# Patient Record
Sex: Female | Born: 1975 | Race: White | Hispanic: No | Marital: Married | Smoking: Former smoker
Health system: Southern US, Community
[De-identification: ages and names within clinical notes are randomized; demographics above are authoritative.]

## PROBLEM LIST (undated history)

## (undated) DIAGNOSIS — N281 Cyst of kidney, acquired: Secondary | ICD-10-CM

## (undated) DIAGNOSIS — Z973 Presence of spectacles and contact lenses: Secondary | ICD-10-CM

## (undated) DIAGNOSIS — K219 Gastro-esophageal reflux disease without esophagitis: Secondary | ICD-10-CM

## (undated) HISTORY — PX: LEEP: SHX91

---

## 2004-11-22 ENCOUNTER — Emergency Department (HOSPITAL_COMMUNITY): Admission: EM | Admit: 2004-11-22 | Discharge: 2004-11-22 | Payer: Self-pay | Admitting: Emergency Medicine

## 2006-09-27 IMAGING — CR DG ANKLE COMPLETE 3+V*L*
3 series · 3 of 3 positions shown · non-contrast
Comparison: No prior studies.

CLINICAL DATA: Lateral malleolar pain. 
 3-VIEW LEFT ANKLE:

[t ankle joint ap left]
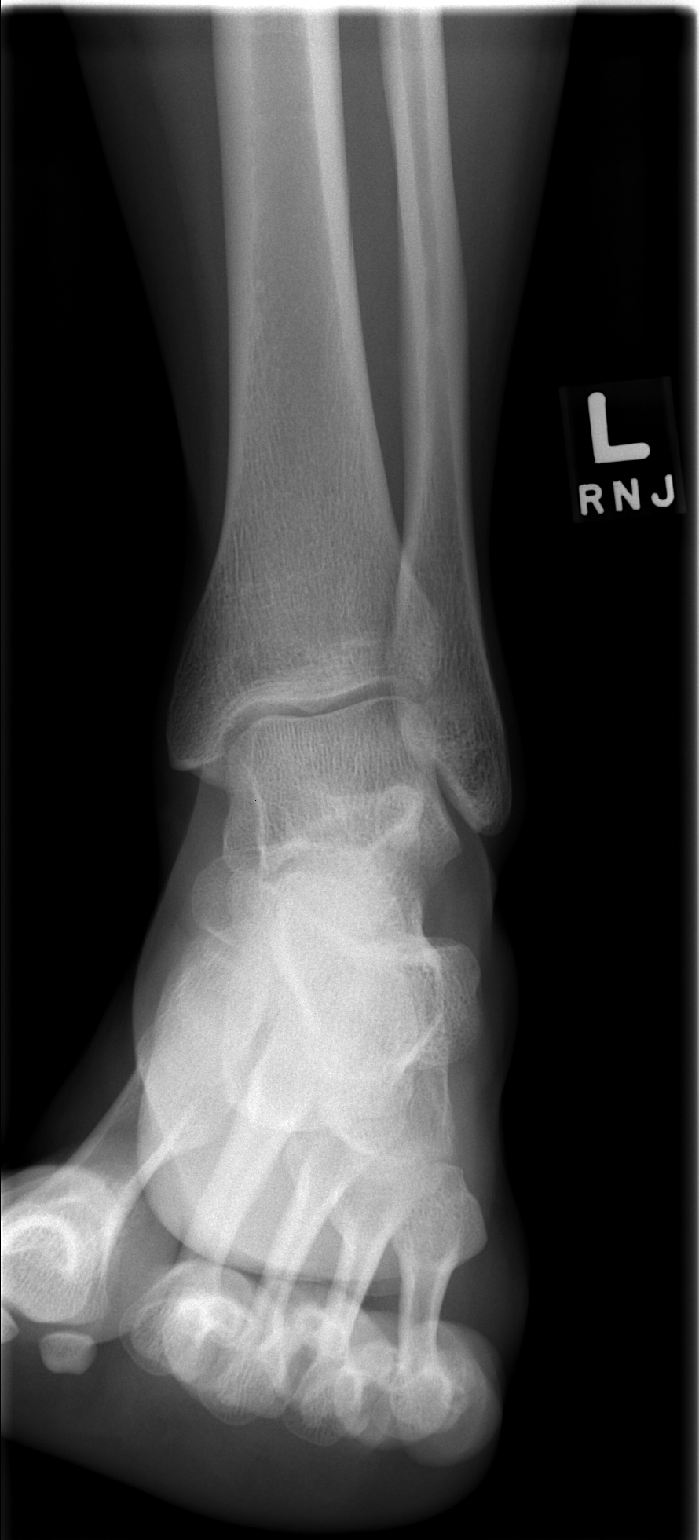

[t ankle joint oblique left]
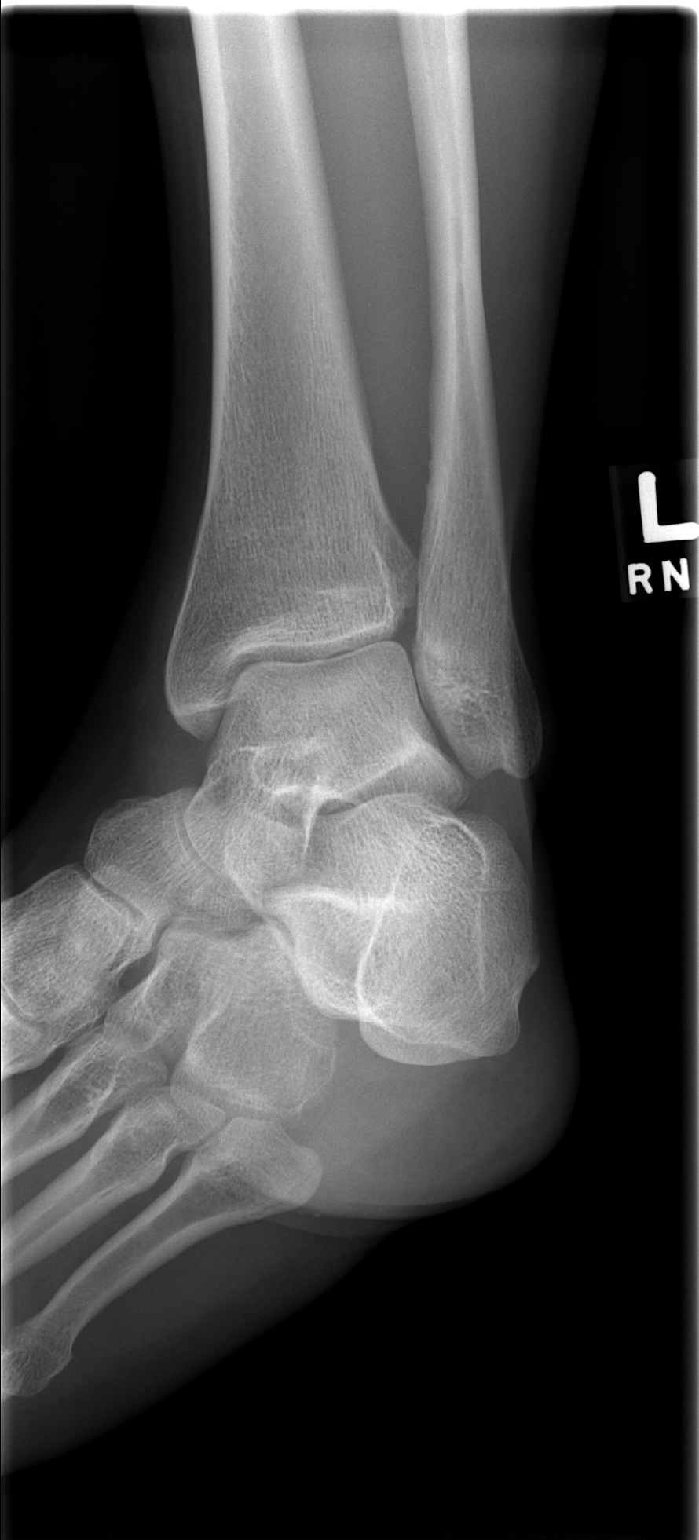

[t ankle joint lat left]
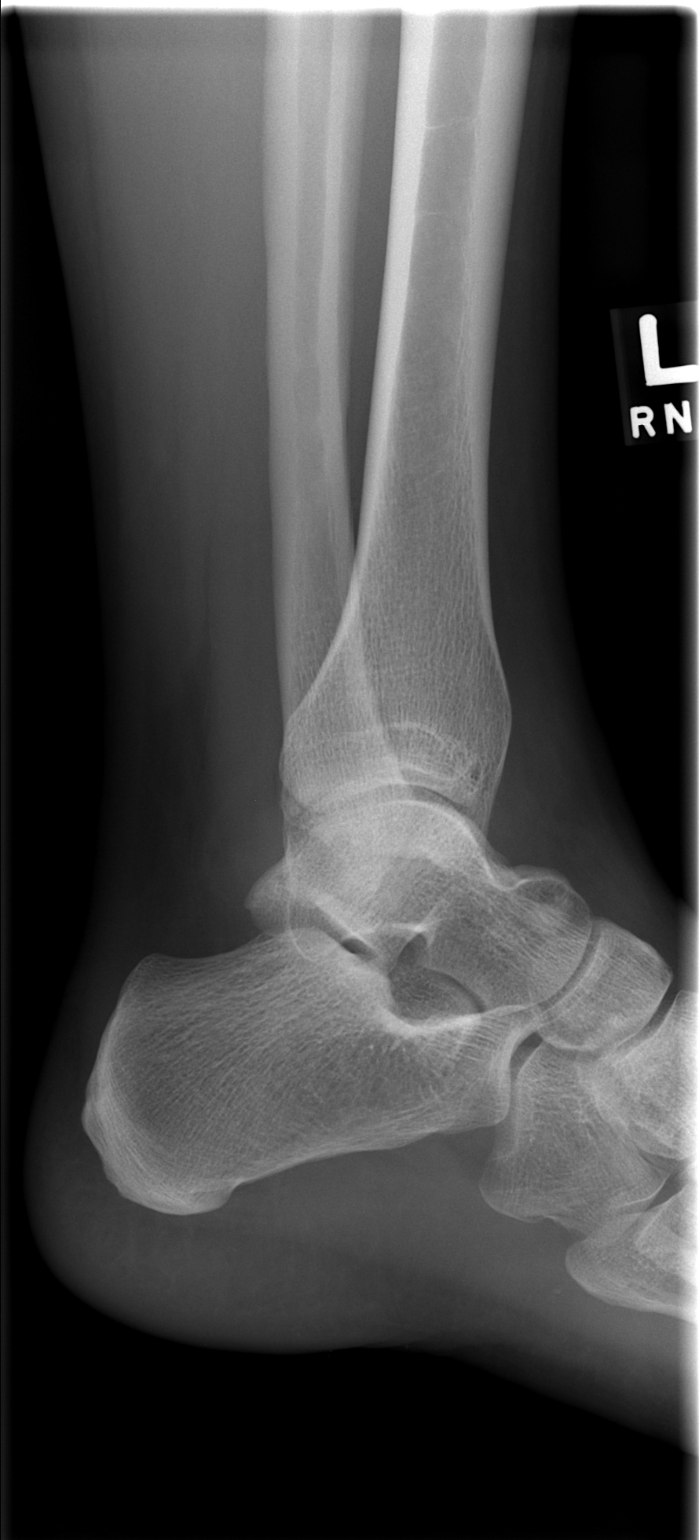

[3 of 3 positions shown; findings below may reference images not displayed]

There is no evidence of fracture, dislocation, or joint effusion.   There is no evidence of arthropathy or other focal bone abnormality.  Soft tissues are unremarkable.
IMPRESSION: Negative.

## 2011-10-11 ENCOUNTER — Other Ambulatory Visit (HOSPITAL_COMMUNITY): Payer: Self-pay | Admitting: Obstetrics and Gynecology

## 2011-10-11 DIAGNOSIS — N979 Female infertility, unspecified: Secondary | ICD-10-CM

## 2011-10-16 ENCOUNTER — Ambulatory Visit (HOSPITAL_COMMUNITY)
Admission: RE | Admit: 2011-10-16 | Discharge: 2011-10-16 | Disposition: A | Discharge status: Home / Self Care | Payer: BC Managed Care – PPO | Source: Ambulatory Visit | Attending: Obstetrics and Gynecology | Admitting: Obstetrics and Gynecology

## 2011-10-16 DIAGNOSIS — N979 Female infertility, unspecified: Secondary | ICD-10-CM | POA: Insufficient documentation

## 2011-10-16 MED ORDER — IOHEXOL 300 MG/ML  SOLN
20.0000 mL | Freq: Once | INTRAMUSCULAR | Status: AC | PRN
  Administered 2011-10-16: 20 mL

## 2011-12-31 LAB — OB RESULTS CONSOLE RUBELLA ANTIBODY, IGM: Rubella: IMMUNE

## 2011-12-31 LAB — OB RESULTS CONSOLE HEPATITIS B SURFACE ANTIGEN: Hepatitis B Surface Ag: NEGATIVE

## 2011-12-31 LAB — OB RESULTS CONSOLE HIV ANTIBODY (ROUTINE TESTING): HIV: NONREACTIVE

## 2011-12-31 LAB — OB RESULTS CONSOLE GC/CHLAMYDIA
Chlamydia: NEGATIVE
Gonorrhea: NEGATIVE

## 2011-12-31 LAB — OB RESULTS CONSOLE ABO/RH: RH Type: POSITIVE

## 2012-02-12 NOTE — L&D Delivery Note (Signed)
Delivery Note At 8:22 PM a viable female was delivered via OA Presentation  APGAR: 8, 9;  Placenta status: Intact, Spontaneous.  Cord: 3 vessels with the following complications: None.    Anesthesia: None  Episiotomy: None Lacerations: None Suture Repair: not applicable Est. Blood Loss (mL): 300  Mom to postpartum.  Baby to nursery-stable.  Summer Lindsey 08/02/2012, 8:30 PM

## 2012-07-15 LAB — OB RESULTS CONSOLE GBS: GBS: NEGATIVE

## 2012-08-02 ENCOUNTER — Inpatient Hospital Stay (HOSPITAL_COMMUNITY)
Admission: AD | Admit: 2012-08-02 | Discharge: 2012-08-04 | DRG: 373 | Disposition: A | Discharge status: Home / Self Care | Payer: BC Managed Care – PPO | Source: Ambulatory Visit | Attending: Obstetrics and Gynecology | Admitting: Obstetrics and Gynecology

## 2012-08-02 ENCOUNTER — Inpatient Hospital Stay (HOSPITAL_COMMUNITY)
Admission: AD | Admit: 2012-08-02 | Discharge: 2012-08-02 | Disposition: A | Discharge status: Home / Self Care | Payer: BC Managed Care – PPO | Source: Ambulatory Visit | Attending: Obstetrics and Gynecology | Admitting: Obstetrics and Gynecology

## 2012-08-02 ENCOUNTER — Encounter (HOSPITAL_COMMUNITY): Payer: Self-pay | Admitting: *Deleted

## 2012-08-02 DIAGNOSIS — O09519 Supervision of elderly primigravida, unspecified trimester: Principal | ICD-10-CM | POA: Diagnosis present

## 2012-08-02 DIAGNOSIS — O479 False labor, unspecified: Secondary | ICD-10-CM | POA: Insufficient documentation

## 2012-08-02 DIAGNOSIS — O99334 Smoking (tobacco) complicating childbirth: Secondary | ICD-10-CM | POA: Diagnosis present

## 2012-08-02 LAB — CBC
Hemoglobin: 12.5 g/dL (ref 12.0–15.0)
MCH: 30.2 pg (ref 26.0–34.0)
MCHC: 35.6 g/dL (ref 30.0–36.0)
Platelets: 184 10*3/uL (ref 150–400)
RDW: 13.9 % (ref 11.5–15.5)

## 2012-08-02 MED ORDER — TETANUS-DIPHTH-ACELL PERTUSSIS 5-2.5-18.5 LF-MCG/0.5 IM SUSP: 0.5000 mL | Freq: Once | INTRAMUSCULAR | Status: DC

## 2012-08-02 MED ORDER — LANOLIN HYDROUS EX OINT: TOPICAL_OINTMENT | CUTANEOUS | Status: DC | PRN

## 2012-08-02 MED ORDER — FLEET ENEMA 7-19 GM/118ML RE ENEM: 1.0000 | ENEMA | Freq: Every day | RECTAL | Status: DC | PRN

## 2012-08-02 MED ORDER — FENTANYL 2.5 MCG/ML BUPIVACAINE 1/10 % EPIDURAL INFUSION (WH - ANES): 14.0000 mL/h | INTRAMUSCULAR | Status: DC | PRN

## 2012-08-02 MED ORDER — ERYTHROMYCIN 5 MG/GM OP OINT: 1.0000 "application " | TOPICAL_OINTMENT | Freq: Once | OPHTHALMIC | Status: DC

## 2012-08-02 MED ORDER — EPHEDRINE 5 MG/ML INJ
10.0000 mg | INTRAVENOUS | Status: DC | PRN
  Filled 2012-08-02: qty 2

## 2012-08-02 MED ORDER — ONDANSETRON HCL 4 MG/2ML IJ SOLN: 4.0000 mg | Freq: Four times a day (QID) | INTRAMUSCULAR | Status: DC | PRN

## 2012-08-02 MED ORDER — PHENYLEPHRINE 40 MCG/ML (10ML) SYRINGE FOR IV PUSH (FOR BLOOD PRESSURE SUPPORT)
80.0000 ug | PREFILLED_SYRINGE | INTRAVENOUS | Status: DC | PRN
  Filled 2012-08-02: qty 2

## 2012-08-02 MED ORDER — IBUPROFEN 600 MG PO TABS
600.0000 mg | ORAL_TABLET | Freq: Four times a day (QID) | ORAL | Status: DC | PRN
  Administered 2012-08-02: 600 mg via ORAL
  Filled 2012-08-02: qty 1

## 2012-08-02 MED ORDER — IBUPROFEN 600 MG PO TABS
600.0000 mg | ORAL_TABLET | Freq: Four times a day (QID) | ORAL | Status: DC
  Administered 2012-08-03 – 2012-08-04 (×5): 600 mg via ORAL
  Filled 2012-08-02 (×5): qty 1

## 2012-08-02 MED ORDER — LACTATED RINGERS IV SOLN: 500.0000 mL | Freq: Once | INTRAVENOUS | Status: DC

## 2012-08-02 MED ORDER — CITRIC ACID-SODIUM CITRATE 334-500 MG/5ML PO SOLN: 30.0000 mL | ORAL | Status: DC | PRN

## 2012-08-02 MED ORDER — ACETAMINOPHEN 325 MG PO TABS: 650.0000 mg | ORAL_TABLET | ORAL | Status: DC | PRN

## 2012-08-02 MED ORDER — LACTATED RINGERS IV SOLN: INTRAVENOUS | Status: DC

## 2012-08-02 MED ORDER — LACTATED RINGERS IV SOLN: 500.0000 mL | INTRAVENOUS | Status: DC | PRN

## 2012-08-02 MED ORDER — OXYCODONE-ACETAMINOPHEN 5-325 MG PO TABS: 1.0000 | ORAL_TABLET | ORAL | Status: DC | PRN

## 2012-08-02 MED ORDER — HEPATITIS B VAC RECOMBINANT 10 MCG/0.5ML IJ SUSP
0.5000 mL | Freq: Once | INTRAMUSCULAR | Status: DC
  Filled 2012-08-02: qty 0.5

## 2012-08-02 MED ORDER — BENZOCAINE-MENTHOL 20-0.5 % EX AERO
1.0000 "application " | INHALATION_SPRAY | CUTANEOUS | Status: DC | PRN
  Filled 2012-08-02: qty 56

## 2012-08-02 MED ORDER — SENNOSIDES-DOCUSATE SODIUM 8.6-50 MG PO TABS
2.0000 | ORAL_TABLET | Freq: Every day | ORAL | Status: DC
  Administered 2012-08-03: 2 via ORAL

## 2012-08-02 MED ORDER — ONDANSETRON HCL 4 MG PO TABS: 4.0000 mg | ORAL_TABLET | ORAL | Status: DC | PRN

## 2012-08-02 MED ORDER — OXYTOCIN 40 UNITS IN LACTATED RINGERS INFUSION - SIMPLE MED
62.5000 mL/h | INTRAVENOUS | Status: DC
  Administered 2012-08-02: 62.5 mL/h via INTRAVENOUS
  Filled 2012-08-02: qty 1000

## 2012-08-02 MED ORDER — DIPHENHYDRAMINE HCL 50 MG/ML IJ SOLN: 12.5000 mg | INTRAMUSCULAR | Status: DC | PRN

## 2012-08-02 MED ORDER — LIDOCAINE HCL (PF) 1 % IJ SOLN
30.0000 mL | INTRAMUSCULAR | Status: DC | PRN
  Filled 2012-08-02 (×2): qty 30

## 2012-08-02 MED ORDER — BISACODYL 10 MG RE SUPP: 10.0000 mg | Freq: Every day | RECTAL | Status: DC | PRN

## 2012-08-02 MED ORDER — DIPHENHYDRAMINE HCL 25 MG PO CAPS: 25.0000 mg | ORAL_CAPSULE | Freq: Four times a day (QID) | ORAL | Status: DC | PRN

## 2012-08-02 MED ORDER — ZOLPIDEM TARTRATE 5 MG PO TABS: 5.0000 mg | ORAL_TABLET | Freq: Every evening | ORAL | Status: DC | PRN

## 2012-08-02 MED ORDER — OXYTOCIN BOLUS FROM INFUSION: 500.0000 mL | INTRAVENOUS | Status: DC

## 2012-08-02 MED ORDER — PRENATAL MULTIVITAMIN CH: 1.0000 | ORAL_TABLET | Freq: Every day | ORAL | Status: DC

## 2012-08-02 MED ORDER — MEASLES, MUMPS & RUBELLA VAC ~~LOC~~ INJ
0.5000 mL | INJECTION | Freq: Once | SUBCUTANEOUS | Status: AC
  Administered 2012-08-04: 0.5 mL via SUBCUTANEOUS
  Filled 2012-08-02: qty 0.5

## 2012-08-02 MED ORDER — WITCH HAZEL-GLYCERIN EX PADS: 1.0000 "application " | MEDICATED_PAD | CUTANEOUS | Status: DC | PRN

## 2012-08-02 MED ORDER — SIMETHICONE 80 MG PO CHEW: 80.0000 mg | CHEWABLE_TABLET | ORAL | Status: DC | PRN

## 2012-08-02 MED ORDER — ONDANSETRON HCL 4 MG/2ML IJ SOLN: 4.0000 mg | INTRAMUSCULAR | Status: DC | PRN

## 2012-08-02 MED ORDER — PRENATAL MULTIVITAMIN CH
1.0000 | ORAL_TABLET | Freq: Every day | ORAL | Status: DC
  Administered 2012-08-03: 1 via ORAL

## 2012-08-02 MED ORDER — MEDROXYPROGESTERONE ACETATE 150 MG/ML IM SUSP: 150.0000 mg | INTRAMUSCULAR | Status: DC | PRN

## 2012-08-02 MED ORDER — DIBUCAINE 1 % RE OINT: 1.0000 "application " | TOPICAL_OINTMENT | RECTAL | Status: DC | PRN

## 2012-08-02 MED ORDER — SUCROSE 24% NICU/PEDS ORAL SOLUTION
0.5000 mL | OROMUCOSAL | Status: DC | PRN
  Filled 2012-08-02: qty 0.5

## 2012-08-02 MED ORDER — VITAMIN K1 1 MG/0.5ML IJ SOLN
1.0000 mg | Freq: Once | INTRAMUSCULAR | Status: DC
  Filled 2012-08-02: qty 0.5

## 2012-08-02 NOTE — MAU Note (Signed)
Pt c/o ctx q 3-4 min. reports some bloody show and fetal moveent. 1cm dlated in office

## 2012-08-02 NOTE — MAU Note (Signed)
Patient presents to MAU with c/o contractions every 3-5 minutes since 0500 today. Reports some bloody show since last check on Thursday in office, where she was 1 cm.  Denies LOF. Reports +FM. Denies HSV.

## 2012-08-02 NOTE — MAU Note (Signed)
Pt here erliere. Ctx stronger closer together. Some more bloody show.

## 2012-08-02 NOTE — H&P (Signed)
Summer Lindsey is a 37 y.o. female presenting for labor. Maternal Medical History:  Reason for admission: Contractions.   Contractions: Onset was 13-24 hours ago.   Frequency: regular.   Perceived severity is strong.    Fetal activity: Perceived fetal activity is normal.    Prenatal complications: no prenatal complications   OB History   Grav Para Term Preterm Abortions TAB SAB Ect Mult Living   1              Past Medical History  Diagnosis Date  . Medical history non-contributory    Past Surgical History  Procedure Laterality Date  . Leep     Family History: family history is not on file. Social History:  reports that she has been smoking.  She does not have any smokeless tobacco history on file. She reports that she does not drink alcohol or use illicit drugs.   Prenatal Transfer Tool  Maternal Diabetes: No Genetic Screening: Normal Maternal Ultrasounds/Referrals: Normal Fetal Ultrasounds or other Referrals:  None Maternal Substance Abuse:  No Significant Maternal Medications:  None Significant Maternal Lab Results:  None Other Comments:  None  Review of Systems  Respiratory: Negative.   Cardiovascular: Negative.     Dilation: 8 Effacement (%): 100;0 Exam by:: Dr.Delfino Friesen Last menstrual period 10/08/2011. Maternal Exam:  Uterine Assessment: Contraction strength is moderate.  Contraction frequency is regular.   Abdomen: not evaluated.  Introitus: not evaluated.     Physical Exam  Cardiovascular: Normal rate, regular rhythm and normal heart sounds.   Respiratory: Effort normal.  GI: Soft.    Prenatal labs: ABO, Rh: O/Positive/-- (11/19 0000) Antibody:   Rubella: Immune (11/19 0000) RPR: Nonreactive (11/19 0000)  HBsAg: Negative (11/19 0000)  HIV: Non-reactive (11/19 0000)  GBS: Negative (06/04 0000)   Assessment/Plan: Labor  Anticipate nsvd   Summer Lindsey L 08/02/2012, 5:50 PM

## 2012-08-03 LAB — CBC
HCT: 31.8 % — ABNORMAL LOW (ref 36.0–46.0)
Hemoglobin: 11 g/dL — ABNORMAL LOW (ref 12.0–15.0)
MCH: 29.5 pg (ref 26.0–34.0)
MCHC: 34.6 g/dL (ref 30.0–36.0)

## 2012-08-03 LAB — RPR: RPR Ser Ql: NONREACTIVE

## 2012-08-03 NOTE — Progress Notes (Signed)
Post Partum Day 1 Subjective: no complaints, up ad lib, voiding and tolerating PO  Objective: Blood pressure 112/76, pulse 87, temperature 98.2 F (36.8 C), temperature source Oral, resp. rate 20, height 5\' 4"  (1.626 m), weight 173 lb (78.472 kg), last menstrual period 10/08/2011, unknown if currently breastfeeding.  Physical Exam:  General: alert and cooperative Lochia: appropriate Uterine Fundus: firm Incision: perineum intact DVT Evaluation: No evidence of DVT seen on physical exam. Negative Homan's sign. No cords or calf tenderness. No significant calf/ankle edema.   Recent Labs  08/02/12 1755 08/03/12 0640  HGB 12.5 11.0*  HCT 35.1* 31.8*    Assessment/Plan: Plan for discharge tomorrow   LOS: 1 day   Gaston Dase G 08/03/2012, 8:30 AM

## 2012-08-04 LAB — RUBELLA SCREEN: Rubella: 0.87 Index (ref <0.90)

## 2012-08-04 MED ORDER — MEASLES, MUMPS & RUBELLA VAC ~~LOC~~ INJ: 0.5000 mL | INJECTION | Freq: Once | SUBCUTANEOUS | Status: DC

## 2012-08-04 MED ORDER — IBUPROFEN 600 MG PO TABS: 600.0000 mg | ORAL_TABLET | Freq: Four times a day (QID) | ORAL | Status: DC

## 2012-08-04 NOTE — Discharge Summary (Signed)
Obstetric Discharge Summary Reason for Admission: onset of labor Prenatal Procedures: ultrasound Intrapartum Procedures: spontaneous vaginal delivery Postpartum Procedures: none Complications-Operative and Postpartum: none Hemoglobin  Date Value Range Status  08/03/2012 11.0* 12.0 - 15.0 g/dL Final     HCT  Date Value Range Status  08/03/2012 31.8* 36.0 - 46.0 % Final    Physical Exam:  General: alert and cooperative Lochia: appropriate Uterine Fundus: firm Incision: perineum intact DVT Evaluation: No evidence of DVT seen on physical exam. Negative Homan's sign. No cords or calf tenderness. No significant calf/ankle edema.  Discharge Diagnoses: Term Pregnancy-delivered  Discharge Information: Date: 08/04/2012 Activity: pelvic rest Diet: routine Medications: PNV and Ibuprofen Condition: stable Instructions: refer to practice specific booklet Discharge to: home   Newborn Data: Live born female  Birth Weight: 7 lb 14.6 oz (3590 g) APGAR: 8, 9  Home with mother.  Summer Lindsey G 08/04/2012, 8:32 AM

## 2012-08-04 NOTE — Progress Notes (Signed)
Does patient need MMR prior to discharge? Thanks

## 2012-08-06 ENCOUNTER — Ambulatory Visit (HOSPITAL_COMMUNITY)
Admission: RE | Admit: 2012-08-06 | Discharge: 2012-08-06 | Disposition: A | Discharge status: Home / Self Care | Payer: BC Managed Care – PPO | Source: Ambulatory Visit | Attending: Obstetrics and Gynecology | Admitting: Obstetrics and Gynecology

## 2012-08-06 NOTE — Lactation Note (Signed)
Adult Lactation Consultation Outpatient Visit Note  Patient Name: Summer Lindsey(mother)    BABY: [name] Date of Birth: Jul 24, 1975                                    DOB: [birth year] Gestational Age at Delivery: [redacted]w[redacted]d                BIRTH WEIGHT: 7-14.6 Type of Delivery: SVD                                     DISCHARGE WEIGHT: 7-8.1                                                                          WEIGHT TODAY:7-4.5(7%) Breastfeeding History: Frequency of Breastfeeding: every 1-1.5 hours Length of Feeding: 15-20 minutes each side Voids: 3 Stools: 8 green stools  Supplementing / Method:NONE Pumping:  Type of Pump:NONE   Frequency:  Volume:    Comments:    Consultation Evaluation:Mom and 4 day old baby here for feeding assessment.  Baby was just seen at pediatricians and it was suggested she make appointment today due to constant cluster feedings and sore nipples.  Mom states milk came in 2 days ago and breasts were very full and painful.  Breasts full today but no engorgement.  Both nipples have abrasions.  Mom states baby falls asleep easily during feeding.  Mom latched baby to breast with initial shallow latch but with chin tug and bringing baby in closer latch improved.  Demonstrated technique for good breast compression prior to feeding.  Instructed on good breast massage and compression to increase milk flow and feeding activity.  Baby responded well and mom noticed improvement in effectiveness and length of feeding.  Breast softened with feeding.  Baby latched to opposite breast well.  Comfort gels given with instructions.  Initial Feeding Assessment:20 MINUTES EACH BREAST Pre-feed Weight:3302 Post-feed Weight:3322 Amount Transferred:20 MLS Comments:  Additional Feeding Assessment: Pre-feed Weight: Post-feed Weight: Amount Transferred: Comments:  Additional Feeding Assessment: Pre-feed Weight: Post-feed Weight: Amount Transferred: Comments:  Total  Breast milk Transferred this Visit: 20 MLS Total Supplement Given: NONE  Additional Interventions:   Follow-Up  Dr. Donnie Coffin 08/08/12, LC office and BF support group prn      Hansel Feinstein 08/06/2012, 10:09 AM

## 2013-08-18 ENCOUNTER — Other Ambulatory Visit: Payer: Self-pay | Admitting: Internal Medicine

## 2013-08-18 DIAGNOSIS — R161 Splenomegaly, not elsewhere classified: Secondary | ICD-10-CM

## 2013-08-24 ENCOUNTER — Ambulatory Visit
Admission: RE | Admit: 2013-08-24 | Discharge: 2013-08-24 | Disposition: A | Discharge status: Home / Self Care | Payer: 59 | Source: Ambulatory Visit | Attending: Internal Medicine | Admitting: Internal Medicine

## 2013-08-24 DIAGNOSIS — R161 Splenomegaly, not elsewhere classified: Secondary | ICD-10-CM

## 2013-10-08 ENCOUNTER — Other Ambulatory Visit (HOSPITAL_COMMUNITY): Payer: Self-pay | Admitting: Urology

## 2013-10-08 DIAGNOSIS — N281 Cyst of kidney, acquired: Secondary | ICD-10-CM

## 2013-11-22 ENCOUNTER — Encounter (HOSPITAL_COMMUNITY): Payer: Self-pay | Admitting: Pharmacy Technician

## 2013-11-24 ENCOUNTER — Other Ambulatory Visit: Payer: Self-pay | Admitting: Radiology

## 2013-11-25 ENCOUNTER — Ambulatory Visit (HOSPITAL_COMMUNITY)
Admission: RE | Admit: 2013-11-25 | Discharge: 2013-11-25 | Disposition: A | Discharge status: Home / Self Care | Payer: 59 | Source: Ambulatory Visit | Attending: Urology | Admitting: Urology

## 2013-11-25 ENCOUNTER — Encounter (HOSPITAL_COMMUNITY): Payer: Self-pay

## 2013-11-25 DIAGNOSIS — Q6100 Congenital renal cyst, unspecified: Secondary | ICD-10-CM | POA: Insufficient documentation

## 2013-11-25 DIAGNOSIS — N281 Cyst of kidney, acquired: Secondary | ICD-10-CM

## 2013-11-25 HISTORY — DX: Cyst of kidney, acquired: N28.1

## 2013-11-25 LAB — HCG, SERUM, QUALITATIVE: Preg, Serum: NEGATIVE

## 2013-11-25 LAB — CBC
HCT: 40.8 % (ref 36.0–46.0)
Hemoglobin: 13.9 g/dL (ref 12.0–15.0)
MCH: 30.3 pg (ref 26.0–34.0)
MCHC: 34.1 g/dL (ref 30.0–36.0)
MCV: 88.9 fL (ref 78.0–100.0)
PLATELETS: 205 10*3/uL (ref 150–400)
RBC: 4.59 MIL/uL (ref 3.87–5.11)
RDW: 12.4 % (ref 11.5–15.5)
WBC: 6.7 10*3/uL (ref 4.0–10.5)

## 2013-11-25 LAB — APTT: APTT: 28 s (ref 24–37)

## 2013-11-25 LAB — PROTIME-INR
INR: 1 (ref 0.00–1.49)
PROTHROMBIN TIME: 13.3 s (ref 11.6–15.2)

## 2013-11-25 MED ORDER — SODIUM CHLORIDE 0.9 % IV SOLN
INTRAVENOUS | Status: DC
  Administered 2013-11-25: 11:00:00 via INTRAVENOUS

## 2013-11-25 MED ORDER — MIDAZOLAM HCL 2 MG/2ML IJ SOLN
INTRAMUSCULAR | Status: AC | PRN
  Administered 2013-11-25: 2 mg via INTRAVENOUS
  Administered 2013-11-25: 1 mg via INTRAVENOUS

## 2013-11-25 MED ORDER — FENTANYL CITRATE 0.05 MG/ML IJ SOLN
INTRAMUSCULAR | Status: AC | PRN
  Administered 2013-11-25: 100 ug via INTRAVENOUS

## 2013-11-25 MED ORDER — MIDAZOLAM HCL 2 MG/2ML IJ SOLN
INTRAMUSCULAR | Status: AC
  Filled 2013-11-25: qty 4

## 2013-11-25 MED ORDER — FENTANYL CITRATE 0.05 MG/ML IJ SOLN
INTRAMUSCULAR | Status: AC
  Filled 2013-11-25: qty 4

## 2013-11-25 NOTE — Procedures (Signed)
Interventional Radiology Procedure Note  Procedure: US guided aspiration of left renal cyst Complications: None Recommendations: - Bedrest x 30 minutes - DC home - Pt to monitor for sx recurrence  Signed,  Sterling BigHeath K. Delon Revelo, MD

## 2013-11-25 NOTE — Discharge Instructions (Signed)
Conscious Sedation, Adult, Care After °Refer to this sheet in the next few weeks. These instructions provide you with information on caring for yourself after your procedure. Your health care provider may also give you more specific instructions. Your treatment has been planned according to current medical practices, but problems sometimes occur. Call your health care provider if you have any problems or questions after your procedure. °WHAT TO EXPECT AFTER THE PROCEDURE  °After your procedure: °· You may feel sleepy, clumsy, and have poor balance for several hours. °· Vomiting may occur if you eat too soon after the procedure. °HOME CARE INSTRUCTIONS °· Do not participate in any activities where you could become injured for at least 24 hours. Do not: °¨ Drive. °¨ Swim. °¨ Ride a bicycle. °¨ Operate heavy machinery. °¨ Cook. °¨ Use power tools. °¨ Climb ladders. °¨ Work from a high place. °· Do not make important decisions or sign legal documents until you are improved. °· If you vomit, drink water, juice, or soup when you can drink without vomiting. Make sure you have little or no nausea before eating solid foods. °· Only take over-the-counter or prescription medicines for pain, discomfort, or fever as directed by your health care provider. °· Make sure you and your family fully understand everything about the medicines given to you, including what side effects may occur. °· You should not drink alcohol, take sleeping pills, or take medicines that cause drowsiness for at least 24 hours. °· If you smoke, do not smoke without supervision. °· If you are feeling better, you may resume normal activities 24 hours after you were sedated. °· Keep all appointments with your health care provider. °SEEK MEDICAL CARE IF: °· Your skin is pale or bluish in color. °· You continue to feel nauseous or vomit. °· Your pain is getting worse and is not helped by medicine. °· You have bleeding or swelling. °· You are still sleepy or  feeling clumsy after 24 hours. °SEEK IMMEDIATE MEDICAL CARE IF: °· You develop a rash. °· You have difficulty breathing. °· You develop any type of allergic problem. °· You have a fever. °MAKE SURE YOU: °· Understand these instructions. °· Will watch your condition. °· Will get help right away if you are not doing well or get worse. °Document Released: 11/18/2012 Document Reviewed: 11/18/2012 °[employer]® Patient Information ©2015 [employer]. This information is not intended to replace advice given to you by your health care provider. Make sure you discuss any questions you have with your health care provider. °Needle Biopsy °Care After °These instructions give you information on caring for yourself after your procedure. Your doctor may also give you more specific instructions. Call your doctor if you have any problems or questions after your procedure. °HOME CARE °· Rest for 4 hours after your biopsy, except for getting up to go to the bathroom or as told. °· Keep the places where the needles were put in clean and dry. °· Do not put powder or lotion on the sites. °· Do not shower until 24 hours after the test. Remove all bandages (dressings) before showering. °· Remove all bandages at least once every day. Gently clean the sites with soap and water. Keep putting a new bandage on until the skin is closed. °Finding out the results of your test °Ask your doctor when your test results will be ready. Make sure you follow up and get the test results. °GET HELP RIGHT AWAY IF:  °· You have shortness of   breath or trouble breathing. °· You have pain or cramping in your belly (abdomen). °· You feel sick to your stomach (nauseous) or throw up (vomit). °· Any of the places where the needles were put in: °¨ Are puffy (swollen) or red. °¨ Are sore or hot to the touch. °¨ Are draining yellowish-white fluid (pus). °¨ Are bleeding after 10 minutes of pressing down on the site. Have someone keep pressing on any place that is  bleeding until you see a doctor. °· You have any unusual pain that will not stop. °· You have a fever. °If you go to the emergency room, tell the nurse that you had a biopsy. Take this paper with you to show the nurse. °MAKE SURE YOU:  °· Understand these instructions. °· Will watch your condition. °· Will get help right away if you are not doing well or get worse. °Document Released: 01/11/2008 Document Revised: 04/22/2011 Document Reviewed: 01/11/2008 °Exit[employer]® Patient Information ©2015 [employer]. This information is not intended to replace advice given to you by your health care provider. Make sure you discuss any questions you have with your health care provider. ° °

## 2013-11-25 NOTE — H&P (Signed)
    Chief Complaint: "I am here for my left kidney cyst aspiration."   Referring Physician(s): Tannenbaum,Sigmund I  History of Present Illness: Summer Lindsey is a 38 y.o. 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 She is scheduled today for image guided cyst aspiration. She denies any current left CVA tenderness. She denies any chest pain, shortness of breath or palpitations. She denies any active signs of bleeding or excessive bruising. She denies any recent fever or chills. The patient denies any history of sleep apnea or chronic oxygen use. She has previously tolerated sedation without complications.    Past Medical History  Diagnosis Date  . Medical history non-contributory   . Cyst of kidney, acquired     left kidney-cyst    Past Surgical History  Procedure Laterality Date  . Leep      Allergies: Acetaminophen  Medications: Prior to Admission medications   Not on File    History reviewed. No pertinent family history.  History   Social History  . Marital Status: Married    Spouse Name: N/A    Number of Children: N/A  . Years of Education: N/A   Social History Main Topics  . Smoking status: Current Every Day Smoker -- 0.50 packs/day  . Smokeless tobacco: None  . Alcohol Use: No  . Drug Use: No  . Sexual Activity: Yes    Birth Control/ Protection: None   Other Topics Concern  . None   Social History Narrative  . None    Review of Systems: A 12 point ROS discussed and pertinent positives are indicated in the HPI above.  All other systems are negative.  Review of Systems  Vital Signs: BP 130/68  Pulse 67  Temp(Src) 98.3 F (36.8 C) (Oral)  Resp 16  Ht 5\' 4"  (1.626 m)  Wt 155 lb (70.308 kg)  BMI 26.59 kg/m2  SpO2 100%  LMP 11/15/2013  Breastfeeding? No  Physical Exam  Constitutional: She is oriented to person, place, and time. She appears well-developed and  well-nourished. No distress.  HENT:  Head: Normocephalic and atraumatic.  Neck: No tracheal deviation present.  Cardiovascular: Normal rate and regular rhythm.  Exam reveals no gallop and no friction rub.   No murmur heard. Pulmonary/Chest: Effort normal and breath sounds normal. No respiratory distress. She has no wheezes. She has no rales.  Abdominal: Soft. Bowel sounds are normal. She exhibits no distension. There is no tenderness.  Neurological: She is alert and oriented to person, place, and time.  Skin: Skin is warm and dry. She is not diaphoretic.  Psychiatric: She has a normal mood and affect. Her behavior is normal. Thought content normal.    Imaging: No results found.  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 All of the patient's questions were answered, patient is agreeable to proceed. Consent signed and in chart.    SignedBerneta Levins: Jayme Mednick D 11/25/2013, 12:31 PM

## 2013-12-13 ENCOUNTER — Encounter (HOSPITAL_COMMUNITY): Payer: Self-pay

## 2014-08-29 ENCOUNTER — Encounter (HOSPITAL_BASED_OUTPATIENT_CLINIC_OR_DEPARTMENT_OTHER): Payer: Self-pay | Admitting: *Deleted

## 2014-08-29 NOTE — Progress Notes (Addendum)
NPO AFTER MN WITH EXCEPTION CLEAR LIQUIDS (NO CREAM/ MILK PRODUCTS) UNTIL 0700.  NEEDS HG.  LAST ABO/Rh IN EPIC AND CHART FROM 2014 (O +).  PRE-OP ORDERS PENDING.

## 2014-08-30 ENCOUNTER — Ambulatory Visit (HOSPITAL_BASED_OUTPATIENT_CLINIC_OR_DEPARTMENT_OTHER): Payer: Commercial Managed Care - HMO | Admitting: Anesthesiology

## 2014-08-30 ENCOUNTER — Encounter (HOSPITAL_BASED_OUTPATIENT_CLINIC_OR_DEPARTMENT_OTHER): Payer: Self-pay | Admitting: *Deleted

## 2014-08-30 ENCOUNTER — Encounter (HOSPITAL_BASED_OUTPATIENT_CLINIC_OR_DEPARTMENT_OTHER)
Admission: RE | Disposition: A | Discharge status: Home / Self Care | Payer: Self-pay | Source: Ambulatory Visit | Attending: Obstetrics and Gynecology

## 2014-08-30 ENCOUNTER — Ambulatory Visit (HOSPITAL_BASED_OUTPATIENT_CLINIC_OR_DEPARTMENT_OTHER)
Admission: RE | Admit: 2014-08-30 | Discharge: 2014-08-30 | Disposition: A | Discharge status: Home / Self Care | Payer: Commercial Managed Care - HMO | Source: Ambulatory Visit | Attending: Obstetrics and Gynecology | Admitting: Obstetrics and Gynecology

## 2014-08-30 DIAGNOSIS — Z3A09 9 weeks gestation of pregnancy: Secondary | ICD-10-CM | POA: Diagnosis not present

## 2014-08-30 DIAGNOSIS — K219 Gastro-esophageal reflux disease without esophagitis: Secondary | ICD-10-CM | POA: Diagnosis not present

## 2014-08-30 DIAGNOSIS — L821 Other seborrheic keratosis: Secondary | ICD-10-CM | POA: Insufficient documentation

## 2014-08-30 DIAGNOSIS — O021 Missed abortion: Secondary | ICD-10-CM | POA: Diagnosis not present

## 2014-08-30 DIAGNOSIS — Z87891 Personal history of nicotine dependence: Secondary | ICD-10-CM | POA: Insufficient documentation

## 2014-08-30 HISTORY — PX: DILATION AND EVACUATION: SHX1459

## 2014-08-30 HISTORY — DX: Presence of spectacles and contact lenses: Z97.3

## 2014-08-30 HISTORY — DX: Gastro-esophageal reflux disease without esophagitis: K21.9

## 2014-08-30 LAB — POCT HEMOGLOBIN-HEMACUE: Hemoglobin: 13.9 g/dL (ref 12.0–15.0)

## 2014-08-30 SURGERY — DILATION AND EVACUATION, UTERUS
Anesthesia: Monitor Anesthesia Care | Site: Uterus

## 2014-08-30 MED ORDER — FENTANYL CITRATE (PF) 100 MCG/2ML IJ SOLN
25.0000 ug | INTRAMUSCULAR | Status: DC | PRN
  Filled 2014-08-30: qty 1

## 2014-08-30 MED ORDER — FENTANYL CITRATE (PF) 100 MCG/2ML IJ SOLN
INTRAMUSCULAR | Status: AC
  Filled 2014-08-30: qty 4

## 2014-08-30 MED ORDER — DEXTROSE 5 % IV SOLN
2.0000 g | INTRAVENOUS | Status: AC
  Administered 2014-08-30: 2 g via INTRAVENOUS
  Filled 2014-08-30: qty 2

## 2014-08-30 MED ORDER — LIDOCAINE HCL (CARDIAC) 20 MG/ML IV SOLN
INTRAVENOUS | Status: DC | PRN
  Administered 2014-08-30: 40 mg via INTRAVENOUS

## 2014-08-30 MED ORDER — MIDAZOLAM HCL 5 MG/5ML IJ SOLN
INTRAMUSCULAR | Status: DC | PRN
  Administered 2014-08-30: 2 mg via INTRAVENOUS
  Administered 2014-08-30 (×2): 1 mg via INTRAVENOUS

## 2014-08-30 MED ORDER — MIDAZOLAM HCL 2 MG/2ML IJ SOLN
INTRAMUSCULAR | Status: AC
  Filled 2014-08-30: qty 2

## 2014-08-30 MED ORDER — FENTANYL CITRATE (PF) 100 MCG/2ML IJ SOLN
INTRAMUSCULAR | Status: DC | PRN
  Administered 2014-08-30: 50 ug via INTRAVENOUS
  Administered 2014-08-30 (×2): 25 ug via INTRAVENOUS

## 2014-08-30 MED ORDER — KETOROLAC TROMETHAMINE 30 MG/ML IJ SOLN
INTRAMUSCULAR | Status: DC | PRN
  Administered 2014-08-30: 30 mg via INTRAVENOUS

## 2014-08-30 MED ORDER — DEXAMETHASONE SODIUM PHOSPHATE 4 MG/ML IJ SOLN
INTRAMUSCULAR | Status: DC | PRN
  Administered 2014-08-30: 4 mg via INTRAVENOUS

## 2014-08-30 MED ORDER — LACTATED RINGERS IV SOLN
INTRAVENOUS | Status: DC
  Administered 2014-08-30: 12:00:00 via INTRAVENOUS
  Filled 2014-08-30: qty 1000

## 2014-08-30 MED ORDER — PROPOFOL 500 MG/50ML IV EMUL
INTRAVENOUS | Status: DC | PRN
  Administered 2014-08-30: 160 ug/kg/min via INTRAVENOUS

## 2014-08-30 MED ORDER — ONDANSETRON HCL 4 MG/2ML IJ SOLN
4.0000 mg | Freq: Once | INTRAMUSCULAR | Status: DC | PRN
  Filled 2014-08-30: qty 2

## 2014-08-30 MED ORDER — CEFOTETAN DISODIUM-DEXTROSE 2-2.08 GM-% IV SOLR
INTRAVENOUS | Status: AC
Start: 2014-08-30 — End: 2014-08-30
  Filled 2014-08-30: qty 50

## 2014-08-30 MED ORDER — SODIUM CHLORIDE 0.9 % IR SOLN
Status: DC | PRN
  Administered 2014-08-30: 500 mL

## 2014-08-30 MED ORDER — METHYLERGONOVINE MALEATE 0.2 MG/ML IJ SOLN
INTRAMUSCULAR | Status: DC | PRN
  Administered 2014-08-30: 0.2 mg via INTRAMUSCULAR

## 2014-08-30 MED ORDER — ONDANSETRON HCL 4 MG/2ML IJ SOLN
INTRAMUSCULAR | Status: DC | PRN
Start: 2014-08-30 — End: 2014-08-30
  Administered 2014-08-30: 4 mg via INTRAVENOUS

## 2014-08-30 SURGICAL SUPPLY — 34 items
CATH ROBINSON RED A/P 16FR (CATHETERS) ×3 IMPLANT
COVER BACK TABLE 60X90IN (DRAPES) ×3 IMPLANT
DRAPE LG THREE QUARTER DISP (DRAPES) ×3 IMPLANT
DRAPE UNDERBUTTOCKS STRL (DRAPE) ×3 IMPLANT
DRSG TELFA 3X8 NADH (GAUZE/BANDAGES/DRESSINGS) ×3 IMPLANT
GLOVE BIO SURGEON STRL SZ 6.5 (GLOVE) ×1 IMPLANT
GLOVE BIO SURGEON STRL SZ8.5 (GLOVE) ×3 IMPLANT
GLOVE BIO SURGEONS STRL SZ 6.5 (GLOVE) ×1
GLOVE INDICATOR 6.5 STRL GRN (GLOVE) ×2 IMPLANT
GLOVE SURG ORTHO 8.0 STRL STRW (GLOVE) ×3 IMPLANT
GOWN STRL REUS W/ TWL LRG LVL3 (GOWN DISPOSABLE) ×1 IMPLANT
GOWN STRL REUS W/ TWL XL LVL3 (GOWN DISPOSABLE) ×1 IMPLANT
GOWN STRL REUS W/TWL LRG LVL3 (GOWN DISPOSABLE) ×6
GOWN STRL REUS W/TWL XL LVL3 (GOWN DISPOSABLE) ×3
KIT BERKELEY 1ST TRIMESTER 3/8 (MISCELLANEOUS) ×3 IMPLANT
LEGGING LITHOTOMY PAIR STRL (DRAPES) ×3 IMPLANT
NDL SPNL 22GX3.5 QUINCKE BK (NEEDLE) ×1 IMPLANT
NEEDLE SPNL 22GX3.5 QUINCKE BK (NEEDLE) ×3 IMPLANT
NS IRRIG 500ML POUR BTL (IV SOLUTION) IMPLANT
PACK BASIN DAY SURGERY FS (CUSTOM PROCEDURE TRAY) ×3 IMPLANT
PAD DRESSING TELFA 3X8 NADH (GAUZE/BANDAGES/DRESSINGS) ×1 IMPLANT
PAD OB MATERNITY 4.3X12.25 (PERSONAL CARE ITEMS) ×3 IMPLANT
PAD PREP 24X48 CUFFED NSTRL (MISCELLANEOUS) ×3 IMPLANT
SET BERKELEY SUCTION TUBING (SUCTIONS) ×3 IMPLANT
SURGILUBE 2OZ TUBE FLIPTOP (MISCELLANEOUS) ×3 IMPLANT
SUT VICRYL RAPIDE 4/0 PS 2 (SUTURE) ×2 IMPLANT
SYR CONTROL 10ML LL (SYRINGE) ×3 IMPLANT
TOP DISP BERKELEY (MISCELLANEOUS) ×3 IMPLANT
TOWEL OR 17X24 6PK STRL BLUE (TOWEL DISPOSABLE) ×3 IMPLANT
TRAY DSU PREP LF (CUSTOM PROCEDURE TRAY) ×3 IMPLANT
VACURETTE 7MM CVD STRL WRAP (CANNULA) IMPLANT
VACURETTE 8 RIGID CVD (CANNULA) IMPLANT
VACURETTE 9 RIGID CVD (CANNULA) IMPLANT
WATER STERILE IRR 500ML POUR (IV SOLUTION) ×3 IMPLANT

## 2014-08-30 NOTE — Anesthesia Procedure Notes (Signed)
Procedure Name: MAC Performed by: Carvell Hoeffner G Pre-anesthesia Checklist: Patient identified, Timeout performed, Emergency Drugs available, Suction available and Patient being monitored Patient Re-evaluated:Patient Re-evaluated prior to inductionOxygen Delivery Method: Nasal cannula Placement Confirmation: positive ETCO2     

## 2014-08-30 NOTE — Transfer of Care (Signed)
Last Vitals:  Filed Vitals:   08/30/14 1208  BP: 116/75  Pulse: 73  Temp: 37 C

## 2014-08-30 NOTE — Transfer of Care (Signed)
Immediate Anesthesia Transfer of Care NoteImmediate Anesthesia Transfer of Care Note  Patient: Summer Lindsey  Procedure(s) Performed: Procedure(s) (LRB): DILATATION AND EVACUATION 6 WKS (N/A)  Patient Location: PACU  Anesthesia Type: mac  Level of Consciousness: awake, alert  and oriented  Airway & Oxygen Therapy: Patient Spontanous Breathing and Patient connected to oxygen  Post-op Assessment: Report given to PACU RN and Post -op Vital signs reviewed and stable  Post vital signs: Reviewed and stable  Complications: No apparent anesthesia complications

## 2014-08-30 NOTE — Anesthesia Preprocedure Evaluation (Addendum)
Anesthesia Evaluation  Patient identified by MRN, date of birth, ID band Patient awake    Reviewed: Allergy & Precautions, NPO status , Patient's Chart, lab work & pertinent test results  History of Anesthesia Complications Negative for: history of anesthetic complications  Airway Mallampati: II  TM Distance: >3 FB Neck ROM: Full    Dental no notable dental hx. (+) Dental Advisory Given   Pulmonary Current Smoker,  breath sounds clear to auscultation  Pulmonary exam normal       Cardiovascular negative cardio ROS Normal cardiovascular examRhythm:Regular Rate:Normal     Neuro/Psych negative neurological ROS  negative psych ROS   GI/Hepatic Neg liver ROS, GERD-  Medicated and Controlled,  Endo/Other  negative endocrine ROS  Renal/GU negative Renal ROS  negative genitourinary   Musculoskeletal negative musculoskeletal ROS (+)   Abdominal   Peds negative pediatric ROS (+)  Hematology negative hematology ROS (+)   Anesthesia Other Findings   Reproductive/Obstetrics 6 week missed ab                          Anesthesia Physical Anesthesia Plan  ASA: II  Anesthesia Plan: MAC   Post-op Pain Management:    Induction: Intravenous  Airway Management Planned: Nasal Cannula  Additional Equipment:   Intra-op Plan:   Post-operative Plan:   Informed Consent: I have reviewed the patients History and Physical, chart, labs and discussed the procedure including the risks, benefits and alternatives for the proposed anesthesia with the patient or authorized representative who has indicated his/her understanding and acceptance.   Dental advisory given  Plan Discussed with: CRNA  Anesthesia Plan Comments:         Anesthesia Quick Evaluation

## 2014-08-30 NOTE — Op Note (Signed)
[name]:  Perfecto, Marticia            ACCOUNT NO.:  [id number]  MEDICAL RECORD NO.:  [id number]  LOCATION:                               [facility]  PHYSICIAN:  Dineen KidDavid C. Rana SnareLowe, M.D.    DATE OF BIRTH:  11-28-1975  DATE OF PROCEDURE:  08/30/2014 DATE OF DISCHARGE:  08/30/2014                              OPERATIVE REPORT   PREOPERATIVE DIAGNOSIS:  Missed abortion, approximately [redacted] weeks gestational age, and desires mole removal.  POSTOPERATIVE DIAGNOSIS:  Missed abortion, approximately 439 weeks gestational age, and desires mole removal.  PROCEDURE:  Dilation, evacuation and removal of left labial mole.  SURGEON:  Candice Campavid Tyreisha Ungar, M.D.  ANESTHESIA:  Monitored anesthetic care and paracervical block.  INDICATIONS:  [name] is a 39 year old, g 3, P 1, at 269 weeks gestational age with ultrasound confirming a missed abortion approximately 6 and half weeks gestational size.  She desires dilation and evacuation.  Risks and benefits of procedure were discussed at length.  Informed consent was obtained.  She furthermore requests removal of the left mons pubis mole and gain risks and benefits were discussed.  DESCRIPTION OF PROCEDURE:  After adequate analgesia, the patient placed in the dorsal lithotomy position.  She is sterilely prepped and draped. Bladder sterilely drained.  Graves speculum was placed.  Tenaculum placed into the cervix.  Paracervical block was placed with 1% Xylocaine and 1:100,000 epinephrine, total 20 mL used. The mons pubis mole was similarly infiltrated with 1% Xylocaine.  Uterus was sounded to 9 cm, easily dilated to a #25 Pratt dilator.  An 8 mm suction curette was inserted.  Suction curettage was carried out retrieving products of conception.  This was performed until a gritty surface was felt throughout the endometrial cavity and no further products retrieved. The patient was given Methergine 0.2 mg IM with good uterine response. The tenaculum and the cervix  turned to be hemostatic.  Once pubis mole was grasped with forceps and easily dissected from the skin using Metzenbaum scissors, the base was then closed with 2 figure-of-eight closer to interrupted sutures with 4-0 Vicryl Rapide with good approximation and good hemostasis noted.  The patient was stable on transfer to recovery room.  Sponge and instrument count was normal x3. Estimated blood loss was minimal.  The patient received 2 g of cefotetan preoperatively, Methergine 0.2 mg IM intraoperatively, and Toradol 30 mg IV intraoperatively.  DISPOSITION:  The patient will be discharged home with followup in the office in 2-3 weeks.  Sent with a routine instruction for D and E; told to return for increased pain, fever, or bleeding.     Dineen Kidavid C. Rana SnareLowe, M.D.     DCL/MEDQ  D:  08/30/2014  T:  08/30/2014  Job:  [id number]

## 2014-08-30 NOTE — Procedures (Signed)
D&E without complications Note dictated Pt also desired mons pubic mole removal, also done without compllcations DL

## 2014-08-30 NOTE — H&P (Signed)
  Summer Lindsey is a 39 yo G3P1 who should be [redacted] weeks EGA but has had 3 ultrasounds showing S<dates and last one on 08/23/14 c/w nonviable pregnancy measuring 6.5 weeks She has not had any bleeding or cramping and at this time requests D&E  Abd Soft nt Pelvic  Uterus 6-8 weeks AV nt Cx cl thick  Imp Plan Missed abortion - [redacted] weeks EGA (6.5 weeks size) Plan D&E R&B discussed at length and informed consent obtained.  Blood type O+

## 2014-08-30 NOTE — Anesthesia Postprocedure Evaluation (Signed)
  Anesthesia Post-op Note  Patient: Summer Lindsey  Procedure(s) Performed: Procedure(s): DILATATION AND EVACUATION 6 WKS (N/A)  Patient Location: PACU  Anesthesia Type:MAC  Level of Consciousness: awake  Airway and Oxygen Therapy: Patient Spontanous Breathing  Post-op Pain: mild  Post-op Assessment: Post-op Vital signs reviewed              Post-op Vital Signs: Reviewed  Last Vitals:  Filed Vitals:   08/30/14 1435  BP:   Pulse: 68  Temp:   Resp: 17    Complications: No apparent anesthesia complications

## 2014-08-30 NOTE — Discharge Instructions (Signed)

## 2014-08-31 ENCOUNTER — Encounter (HOSPITAL_BASED_OUTPATIENT_CLINIC_OR_DEPARTMENT_OTHER): Payer: Self-pay | Admitting: Obstetrics and Gynecology

## 2014-09-16 NOTE — Addendum Note (Signed)
Addendum  created 09/16/14 1208 by Judie Petit, MD   Modules edited: Anesthesia Attestations

## 2015-06-16 ENCOUNTER — Other Ambulatory Visit: Payer: Self-pay | Admitting: Obstetrics and Gynecology

## 2015-09-30 IMAGING — US US ASPIRATION
1 series · 5 of 5 positions shown · non-contrast
Comparison: none

CLINICAL DATA: 38-year-old female with large mildly complex left
renal cyst and intermittent left lower quadrant and left flank pain.
TECHNIQUE: Informed consent was obtained from the patient following explanation
of the procedure, risks, benefits and alternatives. The patient
understands, agrees and consents for the procedure. All questions
were addressed. A time out was performed.

[Series 1: us aspiration · 0.25mm/px · 5 of 5 slices shown]
[im 1/5]
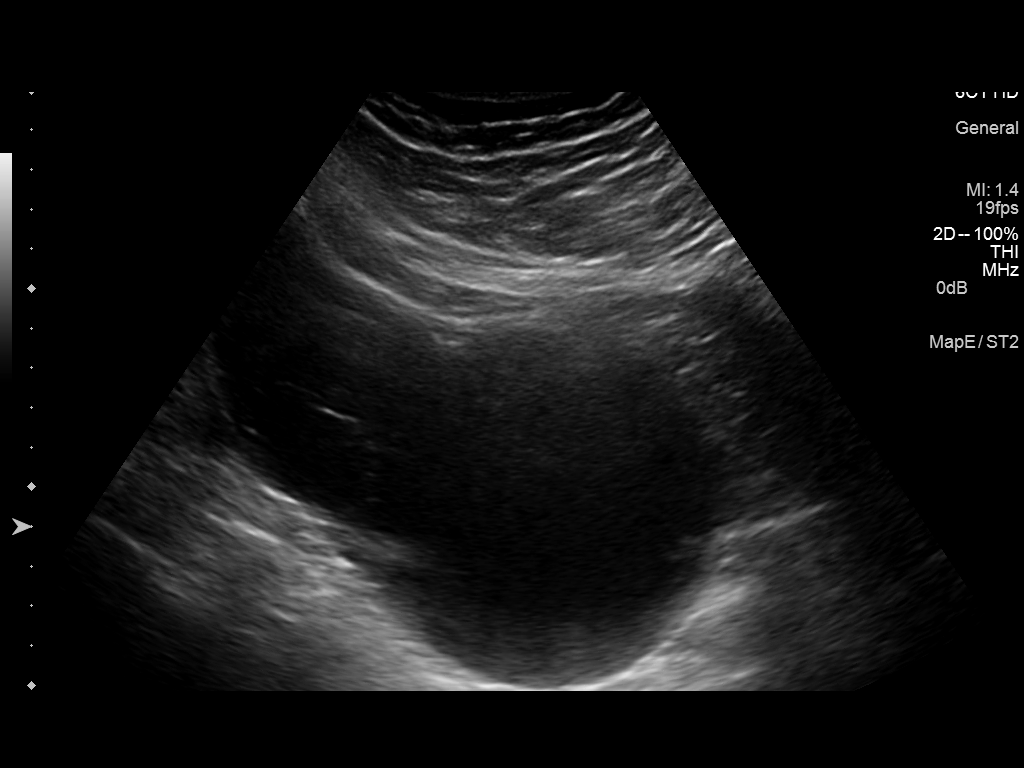
[im 2/5]
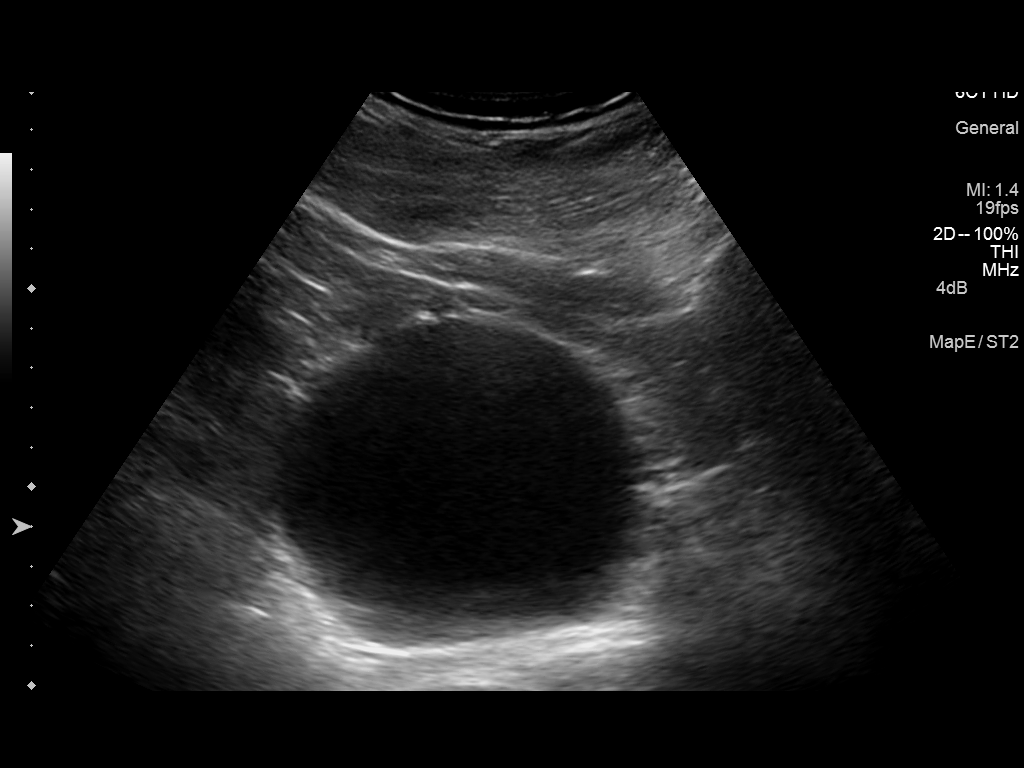
[im 3/5]
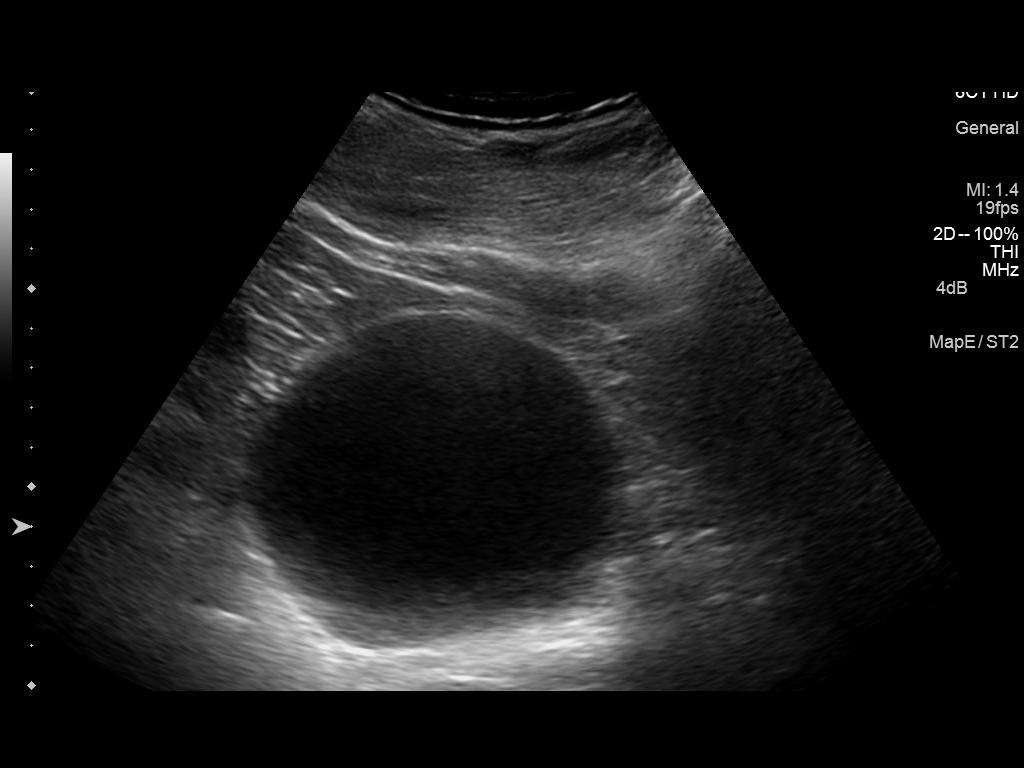
[im 4/5]
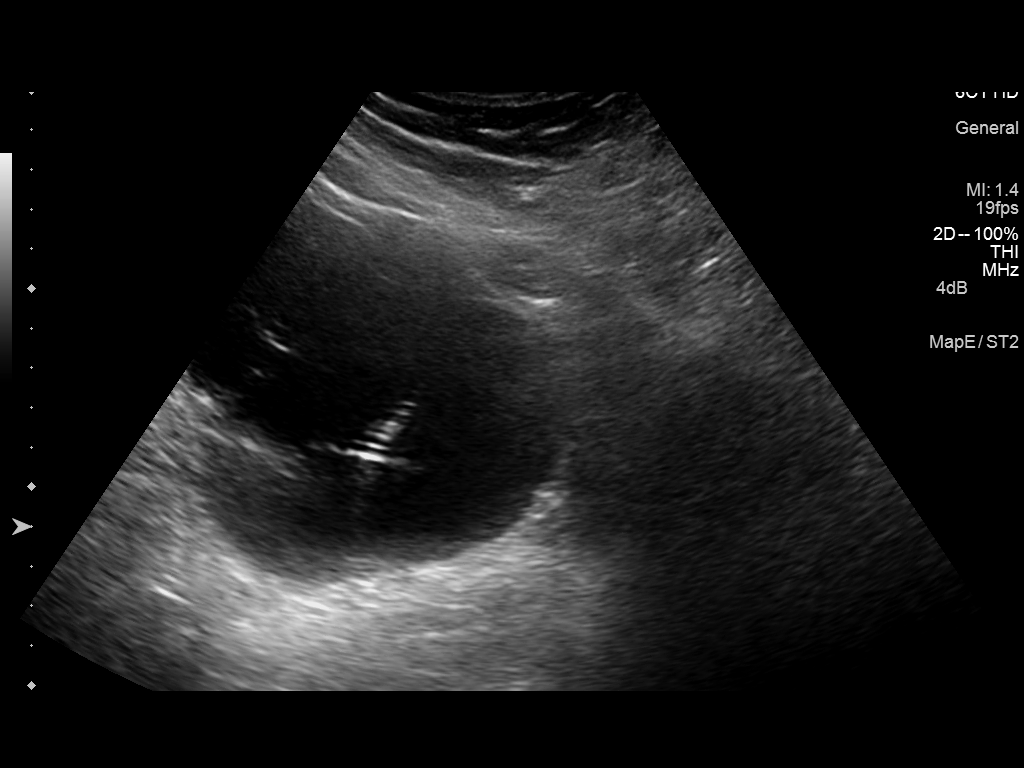
[im 5/5]
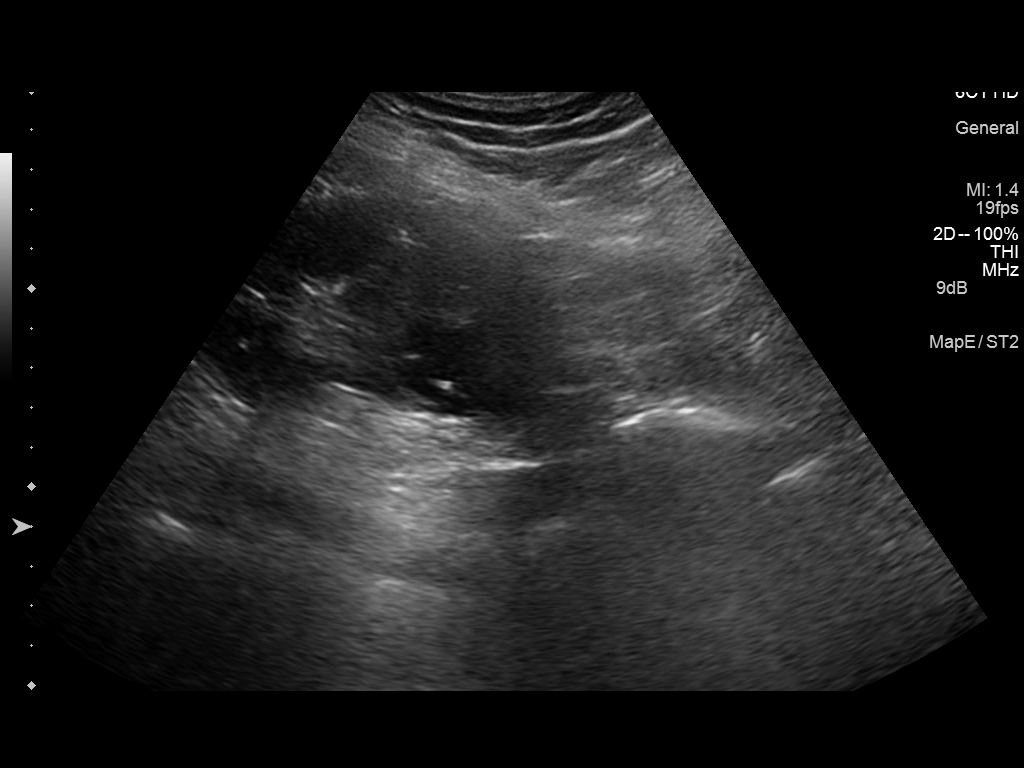

[5 of 5 positions shown; findings below may reference images not displayed]

EXAM:
US ASPIRATION

Date: 11/25/2013

PROCEDURE:
1. Ultrasound-guided cyst aspiration, left kidney

ANESTHESIA/SEDATION:
Moderate (conscious) sedation was used. Three mg Versed, 100 mcg
Fentanyl were administered intravenously. The patient's vital signs
were monitored continuously by radiology nursing throughout the
procedure.

Sedation Time: 15 minutes
The left flank was interrogated with ultrasound. The large mildly
complex cyst emanating from the lower pole of the left kidney was
successfully identified. A suitable skin entry site was selected and
marked. The region was then prepped and draped in the standard
fashion using Betadine skin prep. Local anesthesia was attained by
infiltration with 1% lidocaine. A small dermatotomy was made. Under
real-time sonographic guidance, a 5 Cyrus Cobian centesis catheter
was advanced into the renal cyst.

Approximately 350 cc of clear straw-colored fluid was successfully
aspirated. Post aspiration ultrasound imaging demonstrates total
collapse of the cyst walls. The fluid was sent to pathology for
cytopathologic evaluation. The Kojira centesis catheter was removed
and a sterile Band-Aid was placed. The patient tolerated the
procedure very well.

COMPLICATIONS:
None
IMPRESSION: Successful ultrasound-guided aspiration of mildly complex left lower
pole renal cyst yielding 350 mL clear, straw-colored fluid.

The fluid was sent for cytology.

PLAN:
Occasionally symptomatic renal cysts can recur. The patient is to
monitor herself for recurrent symptoms. If the symptoms takes
several years to recur, intermittent ultrasound-guided aspiration
may be sufficient for symptomatic relief. If, however, symptoms
recur in less than a year then repeat drainage with the drain
placement and 3 days of serial doxycycline from sclerosis may be
warranted tube prevent future cyst recurrence.

## 2015-12-08 ENCOUNTER — Encounter (HOSPITAL_COMMUNITY): Payer: Self-pay | Admitting: Emergency Medicine

## 2015-12-08 ENCOUNTER — Ambulatory Visit (HOSPITAL_COMMUNITY)
Admission: EM | Admit: 2015-12-08 | Discharge: 2015-12-08 | Disposition: A | Discharge status: Home / Self Care | Payer: No Typology Code available for payment source | Attending: Internal Medicine | Admitting: Internal Medicine

## 2015-12-08 ENCOUNTER — Ambulatory Visit (INDEPENDENT_AMBULATORY_CARE_PROVIDER_SITE_OTHER): Payer: No Typology Code available for payment source

## 2015-12-08 DIAGNOSIS — M25462 Effusion, left knee: Secondary | ICD-10-CM

## 2015-12-08 DIAGNOSIS — M1712 Unilateral primary osteoarthritis, left knee: Secondary | ICD-10-CM

## 2015-12-08 DIAGNOSIS — M25562 Pain in left knee: Secondary | ICD-10-CM

## 2015-12-08 NOTE — ED Provider Notes (Signed)
CSN: [id number]     Arrival date & time 12/08/15  1010 History   None    Chief Complaint  Patient presents with  . Knee Pain   (Consider location/radiation/quality/duration/timing/severity/associated sxs/prior Treatment) HPI NP 40 Y/O FEMALE WITH ONSET OF LEFT KNEE PAIN WITHOUT TRAUMA. SUDDEN ONSET OF PAIN. WALKING IMPROVES SYMPTOMS. HAS A STIFFNESS IN THE MORNING THAT SHE HAS TO WALK OFF. WORKS AS AN ACCOUNTANT. STANDING DESK. Past Medical History:  Diagnosis Date  . Cyst of kidney, acquired    left kidney-cyst  . Mild acid reflux   . Wears contact lenses    Past Surgical History:  Procedure Laterality Date  . DILATION AND EVACUATION N/A 08/30/2014   Procedure: DILATATION AND EVACUATION 6 WKS;  Surgeon: Candice Camp, MD;  Location: [facility];  Service: Gynecology;  Laterality: N/A;  . LEEP     History reviewed. No pertinent family history. Social History  Substance Use Topics  . Smoking status: Former Smoker    Packs/day: 0.50    Years: 20.00    Types: Cigarettes  . Smokeless tobacco: Never Used  . Alcohol use No   OB History    Gravida Para Term Preterm AB Living   1 1 1     1    SAB TAB Ectopic Multiple Live Births           1     Review of Systems  Denies: HEADACHE, NAUSEA, ABDOMINAL PAIN, CHEST PAIN, CONGESTION, DYSURIA, SHORTNESS OF BREATH  Allergies  Acetaminophen and Asa [aspirin]  Home Medications   Prior to Admission medications   Not on File   Meds Ordered and Administered this Visit  Medications - No data to display  LMP 11/23/2015  No data found.   Physical Exam NURSES NOTES AND VITAL SIGNS REVIEWED. CONSTITUTIONAL: Well developed, well nourished, no acute distress HEENT: normocephalic, atraumatic EYES: Conjunctiva normal NECK:normal ROM, supple, no adenopathy PULMONARY:No respiratory distress, normal effort ABDOMINAL: Soft, ND, NT BS+, No CVAT MUSCULOSKELETAL: Normal ROM of all extremities, LEFT KNEE, JOINT IS STABLE  WITHOUT LAXITY. NO REDNESS, OR SWELLING.  SKIN: warm and dry without rash PSYCHIATRIC: Mood and affect, behavior are normal  Urgent Care Course   Clinical Course    Procedures (including critical care time)  Labs Review Labs Reviewed - No data to display  Imaging Review Dg Knee Complete 4 Views Left  Result Date: 12/08/2015 CLINICAL DATA:  Pain increased over the week. No known injury. Limited range of motion. EXAM: LEFT KNEE - COMPLETE 4+ VIEW COMPARISON:  None. FINDINGS: There is no acute fracture or subluxation. Trace joint effusion is present. No suspicious lytic or blastic lesions. No significant degenerative changes appear IMPRESSION: Small joint effusion noted. Electronically Signed   By: Norva Pavlov M.D.   On: 12/08/2015 11:40     Visual Acuity Review  Right Eye Distance:   Left Eye Distance:   Bilateral Distance:    Right Eye Near:   Left Eye Near:    Bilateral Near:         MDM   1. Acute pain of left knee   2. Effusion of left knee   3. Primary osteoarthritis of left knee     Patient is reassured that there are no issues that require transfer to higher level of care at this time or additional tests. Patient is advised to continue home symptomatic treatment. Patient is advised that if there are new or worsening symptoms to attend the emergency department, contact primary  care provider, or return to UC. Instructions of care provided discharged home in stable condition.    THIS NOTE WAS GENERATED USING A VOICE RECOGNITION SOFTWARE PROGRAM. ALL REASONABLE EFFORTS  WERE MADE TO PROOFREAD THIS DOCUMENT FOR ACCURACY.  I have verbally reviewed the discharge instructions with the patient. A printed AVS was given to the patient.  All questions were answered prior to discharge.      Tharon AquasFrank C Patrick, PA 12/08/15 1404

## 2015-12-08 NOTE — ED Notes (Signed)
Pt d/c by frank patrick, pa 

## 2015-12-08 NOTE — ED Triage Notes (Signed)
Here for constant left knee pain onset 1 week associated w/stiffness  Denies inj/trauma, numbness/tingly  Pain increases w/activity  Steady gait.... A&O x4... NAD

## 2016-03-13 ENCOUNTER — Other Ambulatory Visit: Payer: Self-pay | Admitting: Obstetrics and Gynecology

## 2016-03-13 DIAGNOSIS — R928 Other abnormal and inconclusive findings on diagnostic imaging of breast: Secondary | ICD-10-CM

## 2016-03-15 ENCOUNTER — Ambulatory Visit
Admission: RE | Admit: 2016-03-15 | Discharge: 2016-03-15 | Disposition: A | Discharge status: Home / Self Care | Payer: No Typology Code available for payment source | Source: Ambulatory Visit | Attending: Obstetrics and Gynecology | Admitting: Obstetrics and Gynecology

## 2016-03-15 DIAGNOSIS — R928 Other abnormal and inconclusive findings on diagnostic imaging of breast: Secondary | ICD-10-CM

## 2016-08-29 ENCOUNTER — Other Ambulatory Visit: Payer: Self-pay | Admitting: Obstetrics and Gynecology

## 2016-08-29 DIAGNOSIS — R921 Mammographic calcification found on diagnostic imaging of breast: Secondary | ICD-10-CM

## 2016-09-13 ENCOUNTER — Ambulatory Visit
Admission: RE | Admit: 2016-09-13 | Discharge: 2016-09-13 | Disposition: A | Discharge status: Home / Self Care | Payer: No Typology Code available for payment source | Source: Ambulatory Visit | Attending: Obstetrics and Gynecology | Admitting: Obstetrics and Gynecology

## 2016-09-13 DIAGNOSIS — R921 Mammographic calcification found on diagnostic imaging of breast: Secondary | ICD-10-CM

## 2016-10-17 LAB — OB RESULTS CONSOLE ABO/RH: RH Type: POSITIVE

## 2016-10-17 LAB — OB RESULTS CONSOLE RUBELLA ANTIBODY, IGM: RUBELLA: IMMUNE

## 2016-10-17 LAB — OB RESULTS CONSOLE HEPATITIS B SURFACE ANTIGEN: Hepatitis B Surface Ag: NEGATIVE

## 2016-10-17 LAB — OB RESULTS CONSOLE ANTIBODY SCREEN: ANTIBODY SCREEN: NEGATIVE

## 2016-10-17 LAB — OB RESULTS CONSOLE HIV ANTIBODY (ROUTINE TESTING): HIV: NONREACTIVE

## 2016-10-17 LAB — OB RESULTS CONSOLE RPR: RPR: NONREACTIVE

## 2016-10-29 LAB — OB RESULTS CONSOLE GC/CHLAMYDIA
CHLAMYDIA, DNA PROBE: NEGATIVE
GC PROBE AMP, GENITAL: NEGATIVE

## 2017-01-28 ENCOUNTER — Other Ambulatory Visit: Payer: Self-pay | Admitting: Obstetrics and Gynecology

## 2017-02-11 NOTE — L&D Delivery Note (Signed)
Delivery Note At 5:51 PM a viable female was delivered via Vaginal, Spontaneous (Presentation: ROA).  APGARs: 9, 9; weight pending.  Placenta status: S, I. 3V Cord with the following complications: loose nuchal x 1, reduced.  Cord pH: n/a  Anesthesia:  CLEA Episiotomy:  None  Lacerations:  none Suture Repair: n/a Est. Blood Loss (mL):  100  Mom to postpartum.  Baby to Couplet care / Skin to Skin.  Drayk Humbarger 05/20/2017, 6:20 PM

## 2017-04-21 LAB — OB RESULTS CONSOLE GBS: STREP GROUP B AG: NEGATIVE

## 2017-05-20 ENCOUNTER — Inpatient Hospital Stay (HOSPITAL_COMMUNITY): Payer: No Typology Code available for payment source | Admitting: Anesthesiology

## 2017-05-20 ENCOUNTER — Other Ambulatory Visit: Payer: Self-pay

## 2017-05-20 ENCOUNTER — Inpatient Hospital Stay (HOSPITAL_COMMUNITY)
Admission: AD | Admit: 2017-05-20 | Discharge: 2017-05-22 | DRG: 807 | Disposition: A | Discharge status: Home / Self Care | Payer: No Typology Code available for payment source | Source: Ambulatory Visit | Attending: Obstetrics & Gynecology | Admitting: Obstetrics & Gynecology

## 2017-05-20 ENCOUNTER — Encounter (HOSPITAL_COMMUNITY): Payer: Self-pay | Admitting: *Deleted

## 2017-05-20 DIAGNOSIS — Z87891 Personal history of nicotine dependence: Secondary | ICD-10-CM

## 2017-05-20 DIAGNOSIS — Z3A39 39 weeks gestation of pregnancy: Secondary | ICD-10-CM

## 2017-05-20 DIAGNOSIS — Z3490 Encounter for supervision of normal pregnancy, unspecified, unspecified trimester: Secondary | ICD-10-CM

## 2017-05-20 DIAGNOSIS — Z3483 Encounter for supervision of other normal pregnancy, third trimester: Secondary | ICD-10-CM | POA: Diagnosis present

## 2017-05-20 LAB — CBC
HCT: 36.1 % (ref 36.0–46.0)
HEMOGLOBIN: 12.7 g/dL (ref 12.0–15.0)
MCH: 30.4 pg (ref 26.0–34.0)
MCHC: 35.2 g/dL (ref 30.0–36.0)
MCV: 86.4 fL (ref 78.0–100.0)
Platelets: 177 10*3/uL (ref 150–400)
RBC: 4.18 MIL/uL (ref 3.87–5.11)
RDW: 13.1 % (ref 11.5–15.5)
WBC: 13.6 10*3/uL — AB (ref 4.0–10.5)

## 2017-05-20 LAB — TYPE AND SCREEN
ABO/RH(D): O POS
Antibody Screen: NEGATIVE

## 2017-05-20 MED ORDER — PRENATAL MULTIVITAMIN CH
1.0000 | ORAL_TABLET | Freq: Every day | ORAL | Status: DC
  Administered 2017-05-21: 1 via ORAL
  Filled 2017-05-20: qty 1

## 2017-05-20 MED ORDER — LIDOCAINE HCL (PF) 1 % IJ SOLN
30.0000 mL | INTRAMUSCULAR | Status: DC | PRN
  Filled 2017-05-20: qty 30

## 2017-05-20 MED ORDER — LACTATED RINGERS IV SOLN
INTRAVENOUS | Status: DC
  Administered 2017-05-20: 16:00:00 via INTRAVENOUS

## 2017-05-20 MED ORDER — EPHEDRINE 5 MG/ML INJ
10.0000 mg | INTRAVENOUS | Status: DC | PRN
  Filled 2017-05-20: qty 2

## 2017-05-20 MED ORDER — DIPHENHYDRAMINE HCL 25 MG PO CAPS: 25.0000 mg | ORAL_CAPSULE | Freq: Four times a day (QID) | ORAL | Status: DC | PRN

## 2017-05-20 MED ORDER — OXYTOCIN 40 UNITS IN LACTATED RINGERS INFUSION - SIMPLE MED
2.5000 [IU]/h | INTRAVENOUS | Status: DC
  Filled 2017-05-20: qty 1000

## 2017-05-20 MED ORDER — SIMETHICONE 80 MG PO CHEW: 80.0000 mg | CHEWABLE_TABLET | ORAL | Status: DC | PRN

## 2017-05-20 MED ORDER — TETANUS-DIPHTH-ACELL PERTUSSIS 5-2.5-18.5 LF-MCG/0.5 IM SUSP: 0.5000 mL | Freq: Once | INTRAMUSCULAR | Status: DC

## 2017-05-20 MED ORDER — PHENYLEPHRINE 40 MCG/ML (10ML) SYRINGE FOR IV PUSH (FOR BLOOD PRESSURE SUPPORT)
80.0000 ug | PREFILLED_SYRINGE | INTRAVENOUS | Status: DC | PRN
  Filled 2017-05-20: qty 5

## 2017-05-20 MED ORDER — PHENYLEPHRINE 40 MCG/ML (10ML) SYRINGE FOR IV PUSH (FOR BLOOD PRESSURE SUPPORT)
80.0000 ug | PREFILLED_SYRINGE | INTRAVENOUS | Status: DC | PRN
  Filled 2017-05-20: qty 10
  Filled 2017-05-20: qty 5

## 2017-05-20 MED ORDER — DIPHENHYDRAMINE HCL 50 MG/ML IJ SOLN: 12.5000 mg | INTRAMUSCULAR | Status: DC | PRN

## 2017-05-20 MED ORDER — FENTANYL CITRATE (PF) 100 MCG/2ML IJ SOLN: 50.0000 ug | INTRAMUSCULAR | Status: DC | PRN

## 2017-05-20 MED ORDER — LIDOCAINE HCL (PF) 1 % IJ SOLN
INTRAMUSCULAR | Status: DC | PRN
  Administered 2017-05-20: 6 mL
  Administered 2017-05-20: 4 mL

## 2017-05-20 MED ORDER — ONDANSETRON HCL 4 MG/2ML IJ SOLN: 4.0000 mg | Freq: Four times a day (QID) | INTRAMUSCULAR | Status: DC | PRN

## 2017-05-20 MED ORDER — ONDANSETRON HCL 4 MG PO TABS: 4.0000 mg | ORAL_TABLET | ORAL | Status: DC | PRN

## 2017-05-20 MED ORDER — LACTATED RINGERS IV SOLN: 500.0000 mL | INTRAVENOUS | Status: DC | PRN

## 2017-05-20 MED ORDER — ZOLPIDEM TARTRATE 5 MG PO TABS: 5.0000 mg | ORAL_TABLET | Freq: Every evening | ORAL | Status: DC | PRN

## 2017-05-20 MED ORDER — LACTATED RINGERS IV SOLN
500.0000 mL | Freq: Once | INTRAVENOUS | Status: AC
  Administered 2017-05-20: 500 mL via INTRAVENOUS

## 2017-05-20 MED ORDER — BENZOCAINE-MENTHOL 20-0.5 % EX AERO: 1.0000 "application " | INHALATION_SPRAY | CUTANEOUS | Status: DC | PRN

## 2017-05-20 MED ORDER — FLEET ENEMA 7-19 GM/118ML RE ENEM: 1.0000 | ENEMA | RECTAL | Status: DC | PRN

## 2017-05-20 MED ORDER — ONDANSETRON HCL 4 MG/2ML IJ SOLN: 4.0000 mg | INTRAMUSCULAR | Status: DC | PRN

## 2017-05-20 MED ORDER — FENTANYL 2.5 MCG/ML BUPIVACAINE 1/10 % EPIDURAL INFUSION (WH - ANES)
14.0000 mL/h | INTRAMUSCULAR | Status: DC | PRN
  Administered 2017-05-20: 14 mL/h via EPIDURAL
  Filled 2017-05-20: qty 100

## 2017-05-20 MED ORDER — OXYTOCIN BOLUS FROM INFUSION
500.0000 mL | Freq: Once | INTRAVENOUS | Status: AC
  Administered 2017-05-20: 500 mL via INTRAVENOUS

## 2017-05-20 MED ORDER — SOD CITRATE-CITRIC ACID 500-334 MG/5ML PO SOLN: 30.0000 mL | ORAL | Status: DC | PRN

## 2017-05-20 MED ORDER — DIBUCAINE 1 % RE OINT: 1.0000 "application " | TOPICAL_OINTMENT | RECTAL | Status: DC | PRN

## 2017-05-20 MED ORDER — SENNOSIDES-DOCUSATE SODIUM 8.6-50 MG PO TABS
2.0000 | ORAL_TABLET | ORAL | Status: DC
  Administered 2017-05-21: 2 via ORAL
  Filled 2017-05-20: qty 2

## 2017-05-20 MED ORDER — COCONUT OIL OIL: 1.0000 "application " | TOPICAL_OIL | Status: DC | PRN

## 2017-05-20 MED ORDER — WITCH HAZEL-GLYCERIN EX PADS: 1.0000 "application " | MEDICATED_PAD | CUTANEOUS | Status: DC | PRN

## 2017-05-20 NOTE — H&P (Signed)
Summer Lindsey is a 42 y.o. female presenting for labor.  CTX started at 4 am and have increased in intensity. She received an epidural immediately on arrival to L&D with SROM immediately prior to CLEA placement.  Antepartum course uncomplicated.  AMA with nl NIPS.  GBS negative.   OB History    Gravida  4   Para  1   Term  1   Preterm      AB  2   Living  1     SAB  2   TAB      Ectopic      Multiple      Live Births  1          Past Medical History:  Diagnosis Date  . Cyst of kidney, acquired    left kidney-cyst  . Mild acid reflux   . Wears contact lenses    Past Surgical History:  Procedure Laterality Date  . DILATION AND EVACUATION N/A 08/30/2014   Procedure: DILATATION AND EVACUATION 6 WKS;  Surgeon: Candice Campavid Lowe, MD;  [facility];  Service: Gynecology;  Laterality: N/A;  . LEEP     Family History: family history is not on file. Social History:  reports that she has quit smoking. Her smoking use included cigarettes. She has a 10.00 pack-year smoking history. She has never used smokeless tobacco. She reports that she does not drink alcohol or use drugs.     Maternal Diabetes: No Genetic Screening: Normal Maternal Ultrasounds/Referrals: Normal Fetal Ultrasounds or other Referrals:  None Maternal Substance Abuse:  No Significant Maternal Medications:  None Significant Maternal Lab Results:  Lab values include: Group B Strep negative Other Comments:  None  ROS Maternal Medical History:  Reason for admission: Contractions.   Contractions: Onset was 6-12 hours ago.   Frequency: regular.   Perceived severity is moderate.    Fetal activity: Perceived fetal activity is normal.   Last perceived fetal movement was within the past hour.    Prenatal complications: no prenatal complications Prenatal Complications - Diabetes: none.    Dilation: 10 Effacement (%): 100 Station: Plus 2 Exam by:: Valentina Lucks. Woods, RN Blood pressure  109/83, pulse (!) 102, temperature 98 F (36.7 C), resp. rate 20, height 5\' 4"  (1.626 m), weight 167 lb 1.9 oz (75.8 kg), SpO2 100 %. Maternal Exam:  Uterine Assessment: Contraction strength is moderate.  Contraction frequency is regular.   Abdomen: Patient reports no abdominal tenderness. Fundal height is c/w dates.   Estimated fetal weight is 7#8.   Fetal presentation: vertex  Introitus: Normal vulva. Pelvis: adequate for delivery.      Physical Exam  Constitutional: She is oriented to person, place, and time. She appears well-developed and well-nourished.  GI: Soft. There is no rebound and no guarding.  Neurological: She is alert and oriented to person, place, and time.  Skin: Skin is warm and dry.  Psychiatric: She has a normal mood and affect. Her behavior is normal.    Prenatal labs: ABO, Rh: --/--/O POS (04/09 1657) Antibody: NEG (04/09 1657) Rubella: Immune (09/06 0000) RPR: Nonreactive (09/06 0000)  HBsAg: Negative (09/06 0000)  HIV: Non-reactive (09/06 0000)  GBS: Negative (03/11 0000)   Assessment/Plan: 41yo G 4P1021 at 8024w2d with labor -Patient progressed rapidly on arrival to L&D.  See delivery note for details of uncomplicated SVD.  Summer Lindsey 05/20/2017, 6:08 PM

## 2017-05-20 NOTE — Anesthesia Preprocedure Evaluation (Signed)
Anesthesia Evaluation  Patient identified by MRN, date of birth, ID band Patient awake    Reviewed: Allergy & Precautions, H&P , Patient's Chart, lab work & pertinent test results  Airway Mallampati: II  TM Distance: >3 FB Neck ROM: full    Dental  (+) Teeth Intact   Pulmonary former smoker,    breath sounds clear to auscultation       Cardiovascular  Rhythm:regular Rate:Normal     Neuro/Psych    GI/Hepatic   Endo/Other    Renal/GU      Musculoskeletal   Abdominal   Peds  Hematology   Anesthesia Other Findings       Reproductive/Obstetrics (+) Pregnancy                             Anesthesia Physical  Anesthesia Plan  ASA: II  Anesthesia Plan: Epidural   Post-op Pain Management:    Induction:   PONV Risk Score and Plan:   Airway Management Planned:   Additional Equipment:   Intra-op Plan:   Post-operative Plan:   Informed Consent: I have reviewed the patients History and Physical, chart, labs and discussed the procedure including the risks, benefits and alternatives for the proposed anesthesia with the patient or authorized representative who has indicated his/her understanding and acceptance.   Dental Advisory Given  Plan Discussed with:   Anesthesia Plan Comments: (Labs checked- platelets confirmed with RN in room. Fetal heart tracing, per RN, reported to be stable enough for sitting procedure. Discussed epidural, and patient consents to the procedure:  included risk of possible headache,backache, failed block, allergic reaction, and nerve injury. This patient was asked if she had any questions or concerns before the procedure started.)        Anesthesia Quick Evaluation  

## 2017-05-20 NOTE — MAU Note (Signed)
Pt states she started having ctx's this morning at 4 am. She states they have worsened in the last hour.   Pt states she had a small amount of vaginal bleeding this morning.   Pt states she is feeling good fetal movement.   Denies LOF

## 2017-05-20 NOTE — Anesthesia Procedure Notes (Signed)
Epidural Patient location during procedure: OB  Staffing Anesthesiologist: Lowery Paullin, MD  Preanesthetic Checklist Completed: patient identified, pre-op evaluation, timeout performed, IV checked, risks and benefits discussed and monitors and equipment checked  Epidural Patient position: sitting Prep: DuraPrep Patient monitoring: blood pressure and continuous pulse ox Approach: right paramedian Location: L3-L4 Injection technique: LOR air  Needle:  Needle type: Tuohy  Needle gauge: 17 G Needle insertion depth: 5 cm Catheter type: closed end flexible Catheter size: 19 Gauge Catheter at skin depth: 10 cm Test dose: negative  Assessment Sensory level: T8  Additional Notes   Dosing of Epidural:  1st dose, through catheter .............................................  Xylocaine 40 mg  2nd dose, through catheter, after waiting 3 minutes.........Xylocaine 60 mg    As each dose occurred, patient was free of IV sx; and patient exhibited no evidence of SA injection.  Patient is more comfortable after epidural dosed. Please see RN's note for documentation of vital signs,and FHR which are stable.  Patient reminded not to try to ambulate with numb legs, and that an RN must be present when she attempts to get up.          

## 2017-05-21 LAB — CBC
HCT: 30.6 % — ABNORMAL LOW (ref 36.0–46.0)
Hemoglobin: 10.6 g/dL — ABNORMAL LOW (ref 12.0–15.0)
MCH: 30.1 pg (ref 26.0–34.0)
MCHC: 34.6 g/dL (ref 30.0–36.0)
MCV: 86.9 fL (ref 78.0–100.0)
Platelets: 146 10*3/uL — ABNORMAL LOW (ref 150–400)
RBC: 3.52 MIL/uL — AB (ref 3.87–5.11)
RDW: 13.3 % (ref 11.5–15.5)
WBC: 11.4 10*3/uL — AB (ref 4.0–10.5)

## 2017-05-21 LAB — RPR: RPR: NONREACTIVE

## 2017-05-21 MED ORDER — IBUPROFEN 600 MG PO TABS
600.0000 mg | ORAL_TABLET | Freq: Four times a day (QID) | ORAL | Status: DC
  Administered 2017-05-21 (×4): 600 mg via ORAL
  Filled 2017-05-21 (×4): qty 1

## 2017-05-21 NOTE — Anesthesia Postprocedure Evaluation (Signed)
Anesthesia Post Note  Patient: Alfonse Sprucerin M Spilker  Procedure(s) Performed: AN AD HOC LABOR EPIDURAL     Patient location during evaluation: Mother Baby Anesthesia Type: Epidural Level of consciousness: awake and alert and oriented Pain management: satisfactory to patient Vital Signs Assessment: post-procedure vital signs reviewed and stable Respiratory status: respiratory function stable Cardiovascular status: stable Postop Assessment: no headache, no backache, epidural receding, patient able to bend at knees, no signs of nausea or vomiting and adequate PO intake Anesthetic complications: no    Last Vitals:  Vitals:   05/21/17 0145 05/21/17 0835  BP: 125/71 115/77  Pulse: 79 76  Resp: 18 16  Temp: 36.8 C 36.8 C  SpO2:      Last Pain:  Vitals:   05/21/17 0845  TempSrc:   PainSc: 0-No pain   Pain Goal: Patients Stated Pain Goal: 1 (05/21/17 0510)               Karleen DolphinFUSSELL,Othello Dickenson

## 2017-05-21 NOTE — Lactation Note (Signed)
This note was copied from a baby's chart. Lactation Consultation Note Baby 11 hrs old mom had just BF for 10 min, was trying to latch to the opposite breast but baby to sleepy. Mom has everted nipples. Attempting to BF in cradle position.  Mom has 42 yr old that she BF for 3 months.  Newborn feeding habits, STS, I&O, cluster feeding discussed. Mom encouraged to feed baby 8-12 times/24 hours and with feeding cues.  Encouraged to call for assistance or questions.  [facility] brochure given w/resources, support groups and LC services.  Patient Name: Summer Lindsey DGUYQ'IToday's Date: 05/21/2017 Reason for consult: Initial assessment   Maternal Data Has patient been taught Hand Expression?: Yes Does the patient have breastfeeding experience prior to this delivery?: Yes  Feeding Feeding Type: Breast Fed Length of feed: 10 min  LATCH Score       Type of Nipple: Everted at rest and after stimulation  Comfort (Breast/Nipple): Soft / non-tender        Interventions Interventions: Breast feeding basics reviewed;Support pillows;Assisted with latch;Position options;Breast massage  Lactation Tools Discussed/Used WIC Program: No   Consult Status Consult Status: Follow-up Date: 05/22/17 Follow-up type: In-patient    Charyl DancerCARVER, [name] 05/21/2017, 4:53 AM

## 2017-05-21 NOTE — Progress Notes (Signed)
Post Partum Day 1 Subjective: no complaints  Objective: Blood pressure 115/77, pulse 76, temperature 98.2 F (36.8 C), temperature source Oral, resp. rate 16, height 5\' 4"  (1.626 m), weight 167 lb 1.9 oz (75.8 kg), SpO2 100 %, unknown if currently breastfeeding.  Physical Exam:  General: alert and cooperative Lochia: appropriate Uterine Fundus: firm Incision: no significant drainage DVT Evaluation: No evidence of DVT seen on physical exam.  Recent Labs    05/20/17 1600 05/21/17 0616  HGB 12.7 10.6*  HCT 36.1 30.6*    Assessment/Plan: Plan for discharge tomorrow and Circumcision prior to discharge   LOS: 1 day   Zelphia CairoGretchen Jeidy Hoerner 05/21/2017, 8:48 AM

## 2017-05-22 MED ORDER — IBUPROFEN 600 MG PO TABS
600.0000 mg | ORAL_TABLET | Freq: Four times a day (QID) | ORAL | Status: DC
  Administered 2017-05-22: 600 mg via ORAL
  Filled 2017-05-22: qty 1

## 2017-05-22 MED ORDER — PRENATAL MULTIVITAMIN CH: 1.0000 | ORAL_TABLET | Freq: Every day | ORAL | 3 refills | Status: AC

## 2017-05-22 NOTE — Discharge Summary (Signed)
Obstetric Discharge Summary Reason for Admission: onset of labor Prenatal Procedures: none Intrapartum Procedures: spontaneous vaginal delivery Postpartum Procedures: none Complications-Operative and Postpartum: none Hemoglobin  Date Value Ref Range Status  05/21/2017 10.6 (L) 12.0 - 15.0 g/dL Final   HCT  Date Value Ref Range Status  05/21/2017 30.6 (L) 36.0 - 46.0 % Final    Physical Exam:  General: alert Lochia: appropriate Uterine Fundus: firm Incision: healing well DVT Evaluation: No evidence of DVT seen on physical exam.  Discharge Diagnoses: Term Pregnancy-delivered  Discharge Information: Date: 05/22/2017 Activity: pelvic rest Diet: routine Medications: PNV Condition: stable Instructions: refer to practice specific booklet Discharge to: home   Newborn Data: Live born female  Birth Weight: 6 lb 11.9 oz (3060 g) APGAR: 9, 9  Newborn Delivery   Birth date/time:  05/20/2017 17:51:00 Delivery type:  Vaginal, Spontaneous     Home with mother.  Rayansh Herbst Milana ObeyM Lindsea Olivar 05/22/2017, 8:53 AM

## 2017-05-22 NOTE — Lactation Note (Signed)
This note was copied from a baby's chart. Lactation Consultation Note  Patient Name: Summer Lindsey ZOXWR'UToday's Date: 05/22/2017  Mom states breastfeeding is going well.  She would like comfort gels as she use them with last baby.  Comfort gels given with instructions.  Discharge instructions given including engorgement treatment.  Lactation outpatient services and support reviewed and encouraged prn.   Maternal Data    Feeding Feeding Type: Breast Fed Length of feed: 10 min  LATCH Score                   Interventions    Lactation Tools Discussed/Used     Consult Status      Enez Monahan S 05/22/2017, 9:00 AM

## 2018-09-09 ENCOUNTER — Other Ambulatory Visit: Payer: Self-pay | Admitting: Obstetrics and Gynecology

## 2018-09-09 DIAGNOSIS — N6489 Other specified disorders of breast: Secondary | ICD-10-CM

## 2018-09-18 ENCOUNTER — Other Ambulatory Visit: Payer: Self-pay

## 2018-09-18 ENCOUNTER — Ambulatory Visit: Payer: No Typology Code available for payment source

## 2018-09-18 ENCOUNTER — Ambulatory Visit
Admission: RE | Admit: 2018-09-18 | Discharge: 2018-09-18 | Disposition: A | Discharge status: Home / Self Care | Payer: No Typology Code available for payment source | Source: Ambulatory Visit | Attending: Obstetrics and Gynecology | Admitting: Obstetrics and Gynecology

## 2018-09-18 DIAGNOSIS — N6489 Other specified disorders of breast: Secondary | ICD-10-CM

## 2019-05-27 DIAGNOSIS — Z03818 Encounter for observation for suspected exposure to other biological agents ruled out: Secondary | ICD-10-CM | POA: Diagnosis not present

## 2019-05-27 DIAGNOSIS — Z20828 Contact with and (suspected) exposure to other viral communicable diseases: Secondary | ICD-10-CM | POA: Diagnosis not present

## 2019-06-23 DIAGNOSIS — Z720 Tobacco use: Secondary | ICD-10-CM | POA: Diagnosis not present

## 2019-08-24 DIAGNOSIS — M2242 Chondromalacia patellae, left knee: Secondary | ICD-10-CM | POA: Diagnosis not present

## 2019-08-27 DIAGNOSIS — M222X2 Patellofemoral disorders, left knee: Secondary | ICD-10-CM | POA: Diagnosis not present

## 2019-08-27 DIAGNOSIS — M94262 Chondromalacia, left knee: Secondary | ICD-10-CM | POA: Diagnosis not present

## 2019-09-06 DIAGNOSIS — M94262 Chondromalacia, left knee: Secondary | ICD-10-CM | POA: Diagnosis not present

## 2019-09-06 DIAGNOSIS — M222X2 Patellofemoral disorders, left knee: Secondary | ICD-10-CM | POA: Diagnosis not present

## 2019-09-21 DIAGNOSIS — M222X2 Patellofemoral disorders, left knee: Secondary | ICD-10-CM | POA: Diagnosis not present

## 2020-03-16 DIAGNOSIS — Z6827 Body mass index (BMI) 27.0-27.9, adult: Secondary | ICD-10-CM | POA: Diagnosis not present

## 2020-03-16 DIAGNOSIS — Z1231 Encounter for screening mammogram for malignant neoplasm of breast: Secondary | ICD-10-CM | POA: Diagnosis not present

## 2020-03-16 DIAGNOSIS — Z01419 Encounter for gynecological examination (general) (routine) without abnormal findings: Secondary | ICD-10-CM | POA: Diagnosis not present

## 2020-03-22 DIAGNOSIS — Z131 Encounter for screening for diabetes mellitus: Secondary | ICD-10-CM | POA: Diagnosis not present

## 2020-03-22 DIAGNOSIS — Z1329 Encounter for screening for other suspected endocrine disorder: Secondary | ICD-10-CM | POA: Diagnosis not present

## 2020-03-22 DIAGNOSIS — Z13220 Encounter for screening for lipoid disorders: Secondary | ICD-10-CM | POA: Diagnosis not present

## 2020-03-22 DIAGNOSIS — Z1321 Encounter for screening for nutritional disorder: Secondary | ICD-10-CM | POA: Diagnosis not present

## 2020-03-22 DIAGNOSIS — Z13228 Encounter for screening for other metabolic disorders: Secondary | ICD-10-CM | POA: Diagnosis not present

## 2020-03-30 DIAGNOSIS — I10 Essential (primary) hypertension: Secondary | ICD-10-CM | POA: Diagnosis not present

## 2020-04-13 DIAGNOSIS — I10 Essential (primary) hypertension: Secondary | ICD-10-CM | POA: Diagnosis not present

## 2020-05-15 DIAGNOSIS — E559 Vitamin D deficiency, unspecified: Secondary | ICD-10-CM | POA: Diagnosis not present

## 2020-05-15 DIAGNOSIS — E781 Pure hyperglyceridemia: Secondary | ICD-10-CM | POA: Diagnosis not present

## 2020-05-15 DIAGNOSIS — I10 Essential (primary) hypertension: Secondary | ICD-10-CM | POA: Diagnosis not present

## 2020-11-08 DIAGNOSIS — I10 Essential (primary) hypertension: Secondary | ICD-10-CM | POA: Diagnosis not present

## 2020-11-08 DIAGNOSIS — Z0000 Encounter for general adult medical examination without abnormal findings: Secondary | ICD-10-CM | POA: Diagnosis not present

## 2020-11-08 DIAGNOSIS — E781 Pure hyperglyceridemia: Secondary | ICD-10-CM | POA: Diagnosis not present

## 2020-11-08 DIAGNOSIS — E559 Vitamin D deficiency, unspecified: Secondary | ICD-10-CM | POA: Diagnosis not present

## 2020-11-15 DIAGNOSIS — Z0000 Encounter for general adult medical examination without abnormal findings: Secondary | ICD-10-CM | POA: Diagnosis not present

## 2020-11-15 DIAGNOSIS — E781 Pure hyperglyceridemia: Secondary | ICD-10-CM | POA: Diagnosis not present

## 2020-11-15 DIAGNOSIS — I10 Essential (primary) hypertension: Secondary | ICD-10-CM | POA: Diagnosis not present

## 2020-11-15 DIAGNOSIS — E559 Vitamin D deficiency, unspecified: Secondary | ICD-10-CM | POA: Diagnosis not present

## 2020-12-07 DIAGNOSIS — L821 Other seborrheic keratosis: Secondary | ICD-10-CM | POA: Diagnosis not present

## 2020-12-07 DIAGNOSIS — Z1283 Encounter for screening for malignant neoplasm of skin: Secondary | ICD-10-CM | POA: Diagnosis not present

## 2020-12-07 DIAGNOSIS — B360 Pityriasis versicolor: Secondary | ICD-10-CM | POA: Diagnosis not present

## 2020-12-12 DIAGNOSIS — Z1211 Encounter for screening for malignant neoplasm of colon: Secondary | ICD-10-CM | POA: Diagnosis not present

## 2020-12-25 DIAGNOSIS — K635 Polyp of colon: Secondary | ICD-10-CM | POA: Diagnosis not present

## 2020-12-25 DIAGNOSIS — Z1211 Encounter for screening for malignant neoplasm of colon: Secondary | ICD-10-CM | POA: Diagnosis not present

## 2020-12-29 DIAGNOSIS — K635 Polyp of colon: Secondary | ICD-10-CM | POA: Diagnosis not present

## 2021-02-17 DIAGNOSIS — K5730 Diverticulosis of large intestine without perforation or abscess without bleeding: Secondary | ICD-10-CM | POA: Diagnosis not present

## 2021-02-17 DIAGNOSIS — Z800 Family history of malignant neoplasm of digestive organs: Secondary | ICD-10-CM | POA: Diagnosis not present

## 2021-04-30 DIAGNOSIS — Z1231 Encounter for screening mammogram for malignant neoplasm of breast: Secondary | ICD-10-CM | POA: Diagnosis not present

## 2021-04-30 DIAGNOSIS — Z01419 Encounter for gynecological examination (general) (routine) without abnormal findings: Secondary | ICD-10-CM | POA: Diagnosis not present

## 2021-04-30 DIAGNOSIS — Z6827 Body mass index (BMI) 27.0-27.9, adult: Secondary | ICD-10-CM | POA: Diagnosis not present

## 2021-05-14 DIAGNOSIS — N900 Mild vulvar dysplasia: Secondary | ICD-10-CM | POA: Diagnosis not present

## 2021-05-14 DIAGNOSIS — N9089 Other specified noninflammatory disorders of vulva and perineum: Secondary | ICD-10-CM | POA: Diagnosis not present

## 2021-06-13 DIAGNOSIS — N900 Mild vulvar dysplasia: Secondary | ICD-10-CM | POA: Diagnosis not present

## 2021-11-12 DIAGNOSIS — E781 Pure hyperglyceridemia: Secondary | ICD-10-CM | POA: Diagnosis not present

## 2021-11-12 DIAGNOSIS — E559 Vitamin D deficiency, unspecified: Secondary | ICD-10-CM | POA: Diagnosis not present

## 2021-11-12 DIAGNOSIS — I10 Essential (primary) hypertension: Secondary | ICD-10-CM | POA: Diagnosis not present

## 2021-11-12 DIAGNOSIS — Z0000 Encounter for general adult medical examination without abnormal findings: Secondary | ICD-10-CM | POA: Diagnosis not present

## 2021-11-19 DIAGNOSIS — E781 Pure hyperglyceridemia: Secondary | ICD-10-CM | POA: Diagnosis not present

## 2021-11-19 DIAGNOSIS — Z0000 Encounter for general adult medical examination without abnormal findings: Secondary | ICD-10-CM | POA: Diagnosis not present

## 2021-11-19 DIAGNOSIS — E559 Vitamin D deficiency, unspecified: Secondary | ICD-10-CM | POA: Diagnosis not present

## 2021-11-19 DIAGNOSIS — M79671 Pain in right foot: Secondary | ICD-10-CM | POA: Diagnosis not present

## 2021-11-19 DIAGNOSIS — I10 Essential (primary) hypertension: Secondary | ICD-10-CM | POA: Diagnosis not present

## 2021-11-24 ENCOUNTER — Ambulatory Visit: Payer: BC Managed Care – PPO | Admitting: Podiatry

## 2022-10-24 DIAGNOSIS — Z01419 Encounter for gynecological examination (general) (routine) without abnormal findings: Secondary | ICD-10-CM | POA: Diagnosis not present

## 2022-10-24 DIAGNOSIS — Z6827 Body mass index (BMI) 27.0-27.9, adult: Secondary | ICD-10-CM | POA: Diagnosis not present

## 2022-10-24 DIAGNOSIS — Z124 Encounter for screening for malignant neoplasm of cervix: Secondary | ICD-10-CM | POA: Diagnosis not present

## 2022-10-24 DIAGNOSIS — Z1231 Encounter for screening mammogram for malignant neoplasm of breast: Secondary | ICD-10-CM | POA: Diagnosis not present

## 2022-11-18 DIAGNOSIS — Z0000 Encounter for general adult medical examination without abnormal findings: Secondary | ICD-10-CM | POA: Diagnosis not present

## 2022-11-18 DIAGNOSIS — I10 Essential (primary) hypertension: Secondary | ICD-10-CM | POA: Diagnosis not present

## 2022-11-18 DIAGNOSIS — E559 Vitamin D deficiency, unspecified: Secondary | ICD-10-CM | POA: Diagnosis not present

## 2022-11-18 DIAGNOSIS — E781 Pure hyperglyceridemia: Secondary | ICD-10-CM | POA: Diagnosis not present

## 2022-11-19 DIAGNOSIS — M7541 Impingement syndrome of right shoulder: Secondary | ICD-10-CM | POA: Diagnosis not present

## 2022-11-25 DIAGNOSIS — E781 Pure hyperglyceridemia: Secondary | ICD-10-CM | POA: Diagnosis not present

## 2022-11-25 DIAGNOSIS — Z0000 Encounter for general adult medical examination without abnormal findings: Secondary | ICD-10-CM | POA: Diagnosis not present

## 2022-11-25 DIAGNOSIS — I10 Essential (primary) hypertension: Secondary | ICD-10-CM | POA: Diagnosis not present

## 2022-12-23 DIAGNOSIS — I10 Essential (primary) hypertension: Secondary | ICD-10-CM | POA: Diagnosis not present

## 2023-10-30 ENCOUNTER — Other Ambulatory Visit: Payer: Self-pay | Admitting: Obstetrics and Gynecology

## 2023-10-30 DIAGNOSIS — R928 Other abnormal and inconclusive findings on diagnostic imaging of breast: Secondary | ICD-10-CM

## 2023-11-07 ENCOUNTER — Ambulatory Visit
Admission: RE | Admit: 2023-11-07 | Discharge: 2023-11-07 | Disposition: A | Discharge status: Home / Self Care | Source: Ambulatory Visit | Attending: Obstetrics and Gynecology | Admitting: Obstetrics and Gynecology

## 2023-11-07 DIAGNOSIS — R928 Other abnormal and inconclusive findings on diagnostic imaging of breast: Secondary | ICD-10-CM

## 2023-11-24 DIAGNOSIS — Z0000 Encounter for general adult medical examination without abnormal findings: Secondary | ICD-10-CM | POA: Diagnosis not present

## 2023-11-24 DIAGNOSIS — E781 Pure hyperglyceridemia: Secondary | ICD-10-CM | POA: Diagnosis not present

## 2023-11-24 DIAGNOSIS — R7301 Impaired fasting glucose: Secondary | ICD-10-CM | POA: Diagnosis not present

## 2023-11-24 DIAGNOSIS — E559 Vitamin D deficiency, unspecified: Secondary | ICD-10-CM | POA: Diagnosis not present

## 2023-11-24 DIAGNOSIS — I10 Essential (primary) hypertension: Secondary | ICD-10-CM | POA: Diagnosis not present

## 2024-01-06 DIAGNOSIS — Z860100 Personal history of colon polyps, unspecified: Secondary | ICD-10-CM | POA: Diagnosis not present
